# Patient Record
Sex: Female | Born: 1990 | Race: Black or African American | Hispanic: No | Marital: Single | Smoking: Former smoker
Health system: Southern US, Community
[De-identification: ages and names within clinical notes are randomized; demographics above are authoritative.]

## PROBLEM LIST (undated history)

## (undated) DIAGNOSIS — O3410 Maternal care for benign tumor of corpus uteri, unspecified trimester: Secondary | ICD-10-CM

## (undated) DIAGNOSIS — D259 Leiomyoma of uterus, unspecified: Secondary | ICD-10-CM

## (undated) DIAGNOSIS — A630 Anogenital (venereal) warts: Secondary | ICD-10-CM

---
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
## 2012-07-19 ENCOUNTER — Ambulatory Visit: Payer: Self-pay

## 2012-07-19 LAB — CBC WITH DIFFERENTIAL/PLATELET
Basophil #: 0.1 10*3/uL (ref 0.0–0.1)
Basophil %: 0.5 %
Eosinophil #: 0.3 10*3/uL (ref 0.0–0.7)
Lymphocyte #: 2.5 10*3/uL (ref 1.0–3.6)
Lymphocyte %: 24 %
MCH: 29.4 pg (ref 26.0–34.0)
MCHC: 33.9 g/dL (ref 32.0–36.0)
MCV: 87 fL (ref 80–100)
Monocyte #: 0.8 x10 3/mm (ref 0.2–0.9)
Monocyte %: 8 %
Platelet: 317 10*3/uL (ref 150–440)
RBC: 5.05 10*6/uL (ref 3.80–5.20)
RDW: 12.5 % (ref 11.5–14.5)
WBC: 10.6 10*3/uL (ref 3.6–11.0)

## 2012-07-19 LAB — COMPREHENSIVE METABOLIC PANEL
Albumin: 4.1 g/dL (ref 3.4–5.0)
Alkaline Phosphatase: 74 U/L (ref 50–136)
BUN: 10 mg/dL (ref 7–18)
Calcium, Total: 9.2 mg/dL (ref 8.5–10.1)
Chloride: 102 mmol/L (ref 98–107)
Creatinine: 0.92 mg/dL (ref 0.60–1.30)
Glucose: 77 mg/dL (ref 65–99)
Osmolality: 272 (ref 275–301)
Potassium: 3.8 mmol/L (ref 3.5–5.1)
SGPT (ALT): 21 U/L (ref 12–78)
Total Protein: 8.2 g/dL (ref 6.4–8.2)

## 2012-07-19 LAB — URINALYSIS, COMPLETE
Bilirubin,UR: NEGATIVE
Glucose,UR: NEGATIVE mg/dL (ref 0–75)
Nitrite: POSITIVE
Ph: 6.5 (ref 4.5–8.0)
Protein: >=2000
RBC,UR: >30 /HPF (ref 0–5)

## 2012-07-29 ENCOUNTER — Ambulatory Visit: Payer: Self-pay

## 2012-07-29 LAB — URINALYSIS, COMPLETE
Bilirubin,UR: NEGATIVE
Glucose,UR: NEGATIVE mg/dL (ref 0–75)
Ketone: NEGATIVE
Leukocyte Esterase: NEGATIVE
Ph: 7.5 (ref 4.5–8.0)
Protein: NEGATIVE
Specific Gravity: 1.01 (ref 1.003–1.030)

## 2013-03-28 ENCOUNTER — Ambulatory Visit: Payer: Self-pay | Admitting: Physician Assistant

## 2013-03-28 LAB — URINALYSIS, COMPLETE
Bilirubin,UR: NEGATIVE
Blood: NEGATIVE
Glucose,UR: NEGATIVE mg/dL (ref 0–75)
KETONE: NEGATIVE
Leukocyte Esterase: NEGATIVE
NITRITE: NEGATIVE
Ph: 7.5 (ref 4.5–8.0)
Specific Gravity: 1.01 (ref 1.003–1.030)
WBC UR: NONE SEEN /HPF (ref 0–5)

## 2013-03-28 LAB — PREGNANCY, URINE: PREGNANCY TEST, URINE: NEGATIVE m[IU]/mL

## 2013-04-08 ENCOUNTER — Ambulatory Visit: Payer: Self-pay | Admitting: Physician Assistant

## 2014-09-29 ENCOUNTER — Ambulatory Visit
Admission: EM | Admit: 2014-09-29 | Discharge: 2014-09-29 | Disposition: A | Discharge status: Home / Self Care | Payer: Self-pay | Attending: Family Medicine | Admitting: Family Medicine

## 2014-09-29 ENCOUNTER — Encounter: Payer: Self-pay | Admitting: Emergency Medicine

## 2014-09-29 DIAGNOSIS — T50901A Poisoning by unspecified drugs, medicaments and biological substances, accidental (unintentional), initial encounter: Secondary | ICD-10-CM

## 2014-09-29 MED ORDER — TUBERCULIN PPD 5 UNIT/0.1ML ID SOLN
5.0000 [IU] | Freq: Once | INTRADERMAL | Status: DC
  Administered 2014-09-29: 5 [IU] via INTRADERMAL

## 2014-09-29 NOTE — ED Notes (Signed)
Pt seen also by Jerelyn Scott, NP and Nira Conn RN for eval and discuss injections.

## 2014-09-29 NOTE — ED Provider Notes (Signed)
CSN: [id number]     Arrival date & time 09/29/14  1506 History   First MD Initiated Contact with Patient 09/29/14 1601     Chief Complaint  Patient presents with  . PPD Placement   (Consider location/radiation/quality/duration/timing/severity/associated sxs/prior Treatment) HPI Comments: Single african [place] female here for PPD for [employer] requirement.  RN Lolita Cram administered PPD to left forearm administered 70ml and requested and I evaluate patient.  The history is provided by the patient.    History reviewed. No pertinent past medical history. History reviewed. No pertinent past surgical history. History reviewed. No pertinent family history. Social History  Substance Use Topics  . Smoking status: Current Some Day Smoker  . Smokeless tobacco: None  . Alcohol Use: No   OB History    No data available     Review of Systems  Constitutional: Negative for fever, chills, diaphoresis, activity change, appetite change and fatigue.  HENT: Negative for congestion, dental problem, drooling, ear discharge, ear pain, facial swelling, hearing loss, mouth sores, nosebleeds, postnasal drip, rhinorrhea, sinus pressure, sneezing, sore throat, tinnitus, trouble swallowing and voice change.   Eyes: Negative for photophobia, pain, discharge, redness, itching and visual disturbance.  Respiratory: Negative for cough, shortness of breath, wheezing and stridor.   Cardiovascular: Negative for chest pain, palpitations and leg swelling.  Gastrointestinal: Negative for nausea, vomiting, abdominal pain, diarrhea, blood in stool, abdominal distention and rectal pain.  Endocrine: Negative for cold intolerance and heat intolerance.  Genitourinary: Negative for dysuria.  Musculoskeletal: Negative for myalgias, back pain, joint swelling, arthralgias, gait problem, neck pain and neck stiffness.  Skin: Negative for color change, pallor, rash and wound.  Allergic/Immunologic: Negative for  environmental allergies and food allergies.  Neurological: Negative for dizziness, tremors, seizures, syncope, facial asymmetry, speech difficulty, weakness, light-headedness, numbness and headaches.  Hematological: Negative for adenopathy. Does not bruise/bleed easily.  Psychiatric/Behavioral: Negative for behavioral problems, confusion, sleep disturbance and agitation.    Allergies  Review of patient's allergies indicates no known allergies.  Home Medications   Prior to Admission medications   Not on File   Meds Ordered and Administered this Visit   Medications - No data to display  BP 113/71 mmHg  Pulse 72  Temp(Src) 98.5 F (36.9 C) (Oral)  Resp 16  Ht 5' (1.524 m)  Wt 120 lb (54.432 kg)  BMI 23.44 kg/m2  SpO2 100% No data found.   Physical Exam  Constitutional: She is oriented to person, place, and time. Vital signs are normal. She appears well-developed and well-nourished. No distress.  HENT:  Head: Normocephalic and atraumatic.  Right Ear: External ear normal.  Left Ear: External ear normal.  Nose: Nose normal.  Mouth/Throat: Oropharynx is clear and moist. No oropharyngeal exudate.  Eyes: Conjunctivae, EOM and lids are normal. Pupils are equal, round, and reactive to light. Right eye exhibits no discharge. Left eye exhibits no discharge. No scleral icterus.  Neck: Trachea normal and normal range of motion. Neck supple. No tracheal deviation present.  Cardiovascular: Normal rate, regular rhythm and intact distal pulses.   Pulmonary/Chest: Effort normal and breath sounds normal. No stridor. No respiratory distress. She has no wheezes. She has no rales.  Abdominal: Soft. She exhibits no distension.  Musculoskeletal: Normal range of motion. She exhibits no edema or tenderness.  Neurological: She is alert and oriented to person, place, and time. She exhibits normal muscle tone. Coordination normal.  Skin: Skin is warm, dry and intact. No abrasion, no bruising, no burn,  no ecchymosis, no laceration, no lesion, no petechiae and no rash noted. She is not diaphoretic. No cyanosis or erythema. No pallor. Nails show no clubbing.     Subdermal wheel 1cm diameter left anterior forearm no erythema, ecchymosis, itchiness, bleeding or tenderness noted  Psychiatric: She has a normal mood and affect. Her speech is normal and behavior is normal. Judgment and thought content normal. Cognition and memory are normal.  Nursing note and vitals reviewed.   ED Course  Procedures (including critical care time)  Labs Review Labs Reviewed - No data to display  Imaging Review No results found.  [place] contacted pharmacist and manufacturer while I spoke with patient that clarification was being sought regarding if new PPD could be placed in her right forearm or if a wait time would be required after a larger than normal dose was placed in her left forearm by the RN.  Patient denied any pain, SOB, dyspnea, tongue/throat swelling, cough, itching, nausea or vomiting.  Discussed with patient to monitor left forearm over the weekend for rash, edema, erythema, pain and to contact me if any of these symptoms occur as I am working all weekend.  Patient verbalized understanding of information/instructions, agreed with plan of care and had no further questions at this time. MDM   1. Medication administered in error, accidental or unintentional, initial encounter    Patient to follow up for routine PPD reading right arm as instructed by Lolita Cram Sunday.  Follow up sooner if new symptoms left arm, dyspnea, tongue swelling, pruritis, dermatitis.  Patient verbalized understanding of information/instructions, agreed with plan of care and had no further questions at this time.    Olen Cordial, NP 10/01/14 [phone]

## 2014-09-29 NOTE — ED Notes (Signed)
Injected full 1 ml into Left forearm, 50 units, instead of 5 units. Called pharmacy and [employer]. Was informed by company nurse Wyvonne Lenz RN that can give correct dose in other arm and to monitor site where received too much for any adverse reaction.

## 2014-09-29 NOTE — Discharge Instructions (Signed)
Continue to monitor left forearm for swelling, erythema, pain, purulent discharge

## 2014-09-29 NOTE — ED Notes (Signed)
TB shot for school .

## 2014-10-02 ENCOUNTER — Telehealth: Payer: Self-pay | Admitting: Emergency Medicine

## 2014-10-02 NOTE — ED Notes (Signed)
Pt returned for the PPD read. Left arm that received 1 ml of TST showed very slight redness, no induration. Pt reported it was a little more red over the weekend but no other adverse reaction.  Right arm where correct dose PPD placed, no redness or induration. Form completed.

## 2015-05-30 ENCOUNTER — Ambulatory Visit
Admission: EM | Admit: 2015-05-30 | Discharge: 2015-05-30 | Disposition: A | Discharge status: Home / Self Care | Payer: BLUE CROSS/BLUE SHIELD | Attending: Family Medicine | Admitting: Family Medicine

## 2015-05-30 ENCOUNTER — Encounter: Payer: Self-pay | Admitting: Gynecology

## 2015-05-30 DIAGNOSIS — J0120 Acute ethmoidal sinusitis, unspecified: Secondary | ICD-10-CM

## 2015-05-30 DIAGNOSIS — H6593 Unspecified nonsuppurative otitis media, bilateral: Secondary | ICD-10-CM | POA: Diagnosis not present

## 2015-05-30 MED ORDER — ACETAMINOPHEN 500 MG PO TABS: 1000.0000 mg | ORAL_TABLET | Freq: Four times a day (QID) | ORAL | Status: DC | PRN

## 2015-05-30 MED ORDER — FLUTICASONE PROPIONATE 50 MCG/ACT NA SUSP: 1.0000 | Freq: Two times a day (BID) | NASAL | Status: DC

## 2015-05-30 MED ORDER — AMOXICILLIN-POT CLAVULANATE 875-125 MG PO TABS: 1.0000 | ORAL_TABLET | Freq: Two times a day (BID) | ORAL | Status: DC

## 2015-05-30 MED ORDER — SALINE SPRAY 0.65 % NA SOLN: 2.0000 | NASAL | Status: DC

## 2015-05-30 MED ORDER — LORATADINE 10 MG PO TABS: 10.0000 mg | ORAL_TABLET | Freq: Every day | ORAL | Status: DC

## 2015-05-30 NOTE — ED Provider Notes (Signed)
CSN: [id number]     Arrival date & time 05/30/15  1823 History   First MD Initiated Contact with Patient 05/30/15 1914     Chief Complaint  Patient presents with  . Headache  . Ear Fullness   (Consider location/radiation/quality/duration/timing/severity/associated sxs/prior Treatment) HPI Comments: Single african [place] female here for evaluation of headache and right ear pain.  Frontal headache/sinus started after nasal congestion tried mucinex helped a little with nasal congestion.  Pressure behind eye/bridge of nose and forehead  PMHx seasonal allergies does not take medicine for them.  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 female presenting with headaches and plugged ear sensation. The history is provided by the patient.  Headache Pain location:  Frontal Quality:  Dull Radiates to:  Does not radiate Severity currently:  5/10 Severity at highest:  5/10 Onset quality:  Gradual Timing:  Constant Progression:  Worsening Chronicity:  New Similar to prior headaches: yes   Context: activity   Context: not exposure to bright light, not caffeine, not coughing, not defecating, not eating, not stress, not exposure to cold air, not intercourse, not loud noise and not straining   Relieved by:  Nothing Worsened by:  Activity Ineffective treatments: mucinex. Associated symptoms: congestion, drainage, ear pain, facial pain, sinus pressure and URI   Associated symptoms: no abdominal pain, no back pain, no blurred vision, no cough, no diarrhea, no dizziness, no eye pain, no fatigue, no fever, no focal weakness, no hearing loss, no loss of balance, no myalgias, no nausea, no near-syncope, no neck pain, no neck stiffness, no numbness, no paresthesias, no photophobia, no seizures, no sore throat, no swollen glands, no syncope, no tingling, no visual change, no vomiting and no weakness   Congestion:    Location:  Nasal   Interferes with sleep: no      Interferes with eating/drinking: no   Ear pain:    Location:  Right   Severity:  Moderate   Onset quality:  Gradual   Duration:  3 days   Timing:  Constant   Progression:  Worsening   Chronicity:  New Risk factors: no anger, no family hx of SAH, does not have insomnia and lifestyle not sedentary   Ear Fullness Associated symptoms include headaches. Pertinent negatives include no chest pain, no abdominal pain and no shortness of breath.    History reviewed. No pertinent past medical history. History reviewed. No pertinent past surgical history. No family history on file. Social History  Substance Use Topics  . Smoking status: Current Some Day Smoker  . Smokeless tobacco: None  . Alcohol Use: No   OB History    No data available     Review of Systems  Constitutional: Negative for fever, chills, diaphoresis, activity change, appetite change, fatigue and unexpected weight change.  HENT: Positive for congestion, ear pain, postnasal drip, rhinorrhea and sinus pressure. Negative for dental problem, drooling, ear discharge, facial swelling, hearing loss, mouth sores, nosebleeds, sneezing, sore throat, tinnitus, trouble swallowing and voice change.   Eyes: Negative for blurred vision, photophobia, pain, discharge, redness, itching and visual disturbance.  Respiratory: Negative for cough, choking, chest tightness, shortness of breath, wheezing and stridor.   Cardiovascular: Negative for chest pain, palpitations, leg swelling, syncope and near-syncope.  Gastrointestinal: Negative for nausea, vomiting, abdominal pain, diarrhea, constipation, blood in stool and abdominal distention.  Endocrine: Negative for cold intolerance and heat intolerance.  Genitourinary: Negative for dysuria, hematuria and difficulty  urinating.  Musculoskeletal: Negative for myalgias, back pain, joint swelling, arthralgias, gait problem, neck pain and neck stiffness.  Skin: Negative for color change, pallor, rash  and wound.  Allergic/Immunologic: Positive for environmental allergies. Negative for food allergies.  Neurological: Positive for headaches. Negative for dizziness, tremors, focal weakness, seizures, syncope, facial asymmetry, speech difficulty, weakness, light-headedness, numbness, paresthesias and loss of balance.  Hematological: Negative for adenopathy. Does not bruise/bleed easily.  Psychiatric/Behavioral: Negative for behavioral problems, confusion, sleep disturbance and agitation.    Allergies  Review of patient's allergies indicates no known allergies.  Home Medications   Prior to Admission medications   Medication Sig Start Date End Date Taking? Authorizing Provider  acetaminophen (TYLENOL) 500 MG tablet Take 2 tablets (1,000 mg total) by mouth every 6 (six) hours as needed for mild pain, moderate pain or headache. 05/30/15   Olen Cordial, NP  amoxicillin-clavulanate (AUGMENTIN) 875-125 MG tablet Take 1 tablet by mouth every 12 (twelve) hours. 05/30/15   Olen Cordial, NP  fluticasone (FLONASE) 50 MCG/ACT nasal spray Place 1 spray into both nostrils 2 (two) times daily. 05/30/15   Olen Cordial, NP  loratadine (CLARITIN) 10 MG tablet Take 1 tablet (10 mg total) by mouth daily. 05/30/15 06/13/15  Olen Cordial, NP  sodium chloride (OCEAN) 0.65 % SOLN nasal spray Place 2 sprays into both nostrils every 2 (two) hours while awake. 05/30/15 06/20/15  Olen Cordial, NP   Meds Ordered and Administered this Visit  Medications - No data to display  BP 118/79 mmHg  Pulse 88  Temp(Src) 97.9 F (36.6 C) (Oral)  Resp 16  Ht 5\' 1"  (1.549 m)  Wt 125 lb (56.7 kg)  BMI 23.63 kg/m2  SpO2 100%  LMP 05/07/2015 No data found.   Physical Exam  Constitutional: She is oriented to person, place, and time. Vital signs are normal. She appears well-developed and well-nourished. She is active and cooperative.  Non-toxic appearance. She does not have a sickly appearance. She appears ill.  No distress.  HENT:  Head: Normocephalic and atraumatic.  Right Ear: Hearing, external ear and ear canal normal. A middle ear effusion is present.  Left Ear: Hearing, external ear and ear canal normal. A middle ear effusion is present.  Nose: Mucosal edema and rhinorrhea present. No nose lacerations, sinus tenderness, nasal deformity, septal deviation or nasal septal hematoma. No epistaxis.  No foreign bodies. Right sinus exhibits frontal sinus tenderness. Right sinus exhibits no maxillary sinus tenderness. Left sinus exhibits no maxillary sinus tenderness and no frontal sinus tenderness.  Mouth/Throat: Uvula is midline and mucous membranes are normal. Mucous membranes are not pale, not dry and not cyanotic. She does not have dentures. No oral lesions. No trismus in the jaw. Normal dentition. No dental abscesses, uvula swelling, lacerations or dental caries. Posterior oropharyngeal edema and posterior oropharyngeal erythema present. No oropharyngeal exudate or tonsillar abscesses.  Nasal congestion/voice; bilateral nasal turbinates edema/erythema/clear discharge; bilateral allergic shiners; cobblestoning posterior pharynx; bilateral TMs slight opacity air fluid level right greater than left  Eyes: Conjunctivae, EOM and lids are normal. Pupils are equal, round, and reactive to light. Right eye exhibits no chemosis, no discharge, no exudate and no hordeolum. No foreign body present in the right eye. Left eye exhibits no chemosis, no discharge, no exudate and no hordeolum. No foreign body present in the left eye. Right conjunctiva is not injected. Right conjunctiva has no hemorrhage. Left conjunctiva is not injected. Left conjunctiva has no hemorrhage. No scleral icterus.  Right eye exhibits normal extraocular motion and no nystagmus. Left eye exhibits normal extraocular motion and no nystagmus. Right pupil is round and reactive. Left pupil is round and reactive. Pupils are equal.  Neck: Trachea normal and  normal range of motion. Neck supple. No tracheal tenderness, no spinous process tenderness and no muscular tenderness present. No rigidity. No tracheal deviation, no edema, no erythema and normal range of motion present. No thyroid mass and no thyromegaly present.  Cardiovascular: Normal rate, regular rhythm, S1 normal, S2 normal, normal heart sounds and intact distal pulses.  PMI is not displaced.  Exam reveals no gallop and no friction rub.   No murmur heard. Pulmonary/Chest: Effort normal and breath sounds normal. No accessory muscle usage or stridor. No respiratory distress. She has no decreased breath sounds. She has no wheezes. She has no rhonchi. She has no rales. She exhibits no tenderness.  Abdominal: Soft. She exhibits no distension.  Musculoskeletal: Normal range of motion. She exhibits no edema or tenderness.       Right shoulder: Normal.       Left shoulder: Normal.       Right hip: Normal.       Left hip: Normal.       Right knee: Normal.       Left knee: Normal.       Cervical back: Normal.       Right hand: Normal.       Left hand: Normal.  Lymphadenopathy:       Head (right side): No submental, no submandibular, no tonsillar, no preauricular, no posterior auricular and no occipital adenopathy present.       Head (left side): No submental, no submandibular, no tonsillar, no preauricular, no posterior auricular and no occipital adenopathy present.    She has no cervical adenopathy.       Right cervical: No superficial cervical, no deep cervical and no posterior cervical adenopathy present.      Left cervical: No superficial cervical, no deep cervical and no posterior cervical adenopathy present.  TTP anterior and posterior cervical lymph nodes no nodules or enlargement noted to palpation  Neurological: She is alert and oriented to person, place, and time. She has normal strength. She is not disoriented. She displays no atrophy and no tremor. No cranial nerve deficit or sensory  deficit. She exhibits normal muscle tone. She displays no seizure activity. Coordination and gait normal. GCS eye subscore is 4. GCS verbal subscore is 5. GCS motor subscore is 6.  Skin: Skin is warm, dry and intact. No abrasion, no bruising, no burn, no ecchymosis, no laceration, no lesion, no petechiae and no rash noted. She is not diaphoretic. No cyanosis or erythema. No pallor. Nails show no clubbing.  Psychiatric: She has a normal mood and affect. Her speech is normal and behavior is normal. Judgment and thought content normal. Cognition and memory are normal.  Nursing note and vitals reviewed.   ED Course  Procedures (including critical care time)  Labs Review Labs Reviewed - No data to display  Imaging Review No results found.    MDM   1. Acute ethmoidal sinusitis, recurrence not specified   2. Otitis media with effusion, bilateral    Denied need for school excuse able to concentrate/sleep.  May continue mucinex po BID OTC if helping symptoms.  Patient may use normal saline nasal spray as needed.  Consider antihistamine OTC of choice example claritin 10mg  po daily in addition to flonase 1 spray each  nostril BID.  Avoid triggers if possible.  Shower prior to bedtime if exposed to triggers.  If allergic dust/dust mites recommend mattress/pillow covers/encasements; washing linens, vacuuming, sweeping, dusting weekly.  Call or return to clinic as needed if these symptoms worsen or fail to improve as anticipated.   Exitcare handout on allergic rhinitis given to patient.  Patient verbalized understanding of instructions, agreed with plan of care and had no further questions at this time.  P2:  Avoidance and hand washing.  Supportive treatment.   No evidence of invasive bacterial infection, non toxic and well hydrated.  This is most likely self limiting viral infection.  I do not see where any further testing or imaging is necessary at this time.   I will suggest supportive care, rest, good  hygiene and encourage the patient to take adequate fluids.  The patient is to return to clinic or EMERGENCY ROOM if symptoms worsen or change significantly e.g. ear pain, fever, purulent discharge from ears or bleeding.  Exitcare handout on otitis media with effusion given to patient.  Patient verbalized agreement and understanding of treatment plan.    start flonase 1 spray each nostril BID, saline 2 sprays each nostril q2h prn congestion.  If no improvement with 48 hours of saline and flonase use start augmentin 875mg  po BID x 10 days. Tylenol 1000mg  po QID prn headache/pain.  Rx given.  No evidence of systemic bacterial infection, non toxic and well hydrated.  I do not see where any further testing or imaging is necessary at this time.   I will suggest supportive care, rest, good hygiene and encourage the patient to take adequate fluids.  The patient is to return to clinic or EMERGENCY ROOM if symptoms worsen or change significantly.  Exitcare handout on sinusitis given to patient.  Patient verbalized agreement and understanding of treatment plan and had no further questions at this time.   P2:  Hand washing and cover cough    Olen Cordial, NP 05/30/15 1935

## 2015-05-30 NOTE — ED Notes (Signed)
Patient c/o headache and right ear fullness x 3 days.

## 2015-05-30 NOTE — Discharge Instructions (Signed)
Allergic Rhinitis Allergic rhinitis is when the mucous membranes in the nose respond to allergens. Allergens are particles in the air that cause your body to have an allergic reaction. This causes you to release allergic antibodies. Through a chain of events, these eventually cause you to release histamine into the blood stream. Although meant to protect the body, it is this release of histamine that causes your discomfort, such as frequent sneezing, congestion, and an itchy, runny nose.  CAUSES Seasonal allergic rhinitis (hay fever) is caused by pollen allergens that may come from grasses, trees, and weeds. Year-round allergic rhinitis (perennial allergic rhinitis) is caused by allergens such as house dust mites, pet dander, and mold spores. SYMPTOMS  Nasal stuffiness (congestion).  Itchy, runny nose with sneezing and tearing of the eyes. DIAGNOSIS Your health care provider can help you determine the allergen or allergens that trigger your symptoms. If you and your health care provider are unable to determine the allergen, skin or blood testing may be used. Your health care provider will diagnose your condition after taking your health history and performing a physical exam. Your health care provider may assess you for other related conditions, such as asthma, pink eye, or an ear infection. TREATMENT Allergic rhinitis does not have a cure, but it can be controlled by:  Medicines that block allergy symptoms. These may include allergy shots, nasal sprays, and oral antihistamines.  Avoiding the allergen. Hay fever may often be treated with antihistamines in pill or nasal spray forms. Antihistamines block the effects of histamine. There are over-the-counter medicines that may help with nasal congestion and swelling around the eyes. Check with your health care provider before taking or giving this medicine. If avoiding the allergen or the medicine prescribed do not work, there are many new medicines  your health care provider can prescribe. Stronger medicine may be used if initial measures are ineffective. Desensitizing injections can be used if medicine and avoidance does not work. Desensitization is when a patient is given ongoing shots until the body becomes less sensitive to the allergen. Make sure you follow up with your health care provider if problems continue. HOME CARE INSTRUCTIONS It is not possible to completely avoid allergens, but you can reduce your symptoms by taking steps to limit your exposure to them. It helps to know exactly what you are allergic to so that you can avoid your specific triggers. SEEK MEDICAL CARE IF:  You have a fever.  You develop a cough that does not stop easily (persistent).  You have shortness of breath.  You start wheezing.  Symptoms interfere with normal daily activities.   This information is not intended to replace advice given to you by your health care provider. Make sure you discuss any questions you have with your health care provider.   Document Released: 10/01/2000 Document Revised: 01/27/2014 Document Reviewed: 09/13/2012 Elsevier Interactive Patient Education 2016 [place]. Otitis Media With Effusion Otitis media with effusion is the presence of fluid in the middle ear. This is a common problem in children, which often follows ear infections. It may be present for weeks or longer after the infection. Unlike an acute ear infection, otitis media with effusion refers only to fluid behind the ear drum and not infection. Children with repeated ear and sinus infections and allergy problems are the most likely to get otitis media with effusion. CAUSES  The most frequent cause of the fluid buildup is dysfunction of the eustachian tubes. These are the tubes that drain fluid  in the ears to the back of the nose (nasopharynx). °SYMPTOMS  °· The main symptom of this condition is hearing loss. As a result, you or your child may: °¨ Listen to the TV  at a loud volume. °¨ Not respond to questions. °¨ Ask "what" often when spoken to. °¨ Mistake or confuse one sound or word for another. °· There may be a sensation of fullness or pressure but usually not pain. °DIAGNOSIS  °· Your health care provider will diagnose this condition by examining you or your child's ears. °· Your health care provider may test the pressure in you or your child's ear with a tympanometer. °· A hearing test may be conducted if the problem persists. °TREATMENT  °· Treatment depends on the duration and the effects of the effusion. °· Antibiotics, decongestants, nose drops, and cortisone-type drugs (tablets or nasal spray) may not be helpful. °· Children with persistent ear effusions may have delayed language or behavioral problems. Children at risk for developmental delays in hearing, learning, and speech may require referral to a specialist earlier than children not at risk. °· You or your child's health care provider may suggest a referral to an ear, nose, and throat surgeon for treatment. The following may help restore normal hearing: °¨ Drainage of fluid. °¨ Placement of ear tubes (tympanostomy tubes). °¨ Removal of adenoids (adenoidectomy). °HOME CARE INSTRUCTIONS  °· Avoid secondhand smoke. °· Infants who are breastfed are less likely to have this condition. °· Avoid feeding infants while they are lying flat. °· Avoid known environmental allergens. °· Avoid people who are sick. °SEEK MEDICAL CARE IF:  °· Hearing is not better in 3 months. °· Hearing is worse. °· Ear pain. °· Drainage from the ear. °· Dizziness. °MAKE SURE YOU:  °· Understand these instructions. °· Will watch your condition. °· Will get help right away if you are not doing well or get worse. °  °This information is not intended to replace advice given to you by your health care provider. Make sure you discuss any questions you have with your health care provider. °  °Document Released: 02/14/2004 Document Revised:  01/27/2014 Document Reviewed: 08/03/2012 °Elsevier Interactive Patient Education ©2016 Elsevier Inc[employer] °Sinusitis, Adult °Sinusitis is redness, soreness, and inflammation of the paranasal sinuses. Paranasal sinuses are air pockets within the bones of your face. They are located beneath your eyes, in the middle of your forehead, and above your eyes. In healthy paranasal sinuses, mucus is able to drain out, and air is able to circulate through them by way of your nose. However, when your paranasal sinuses are inflamed, mucus and air can become trapped. This can allow bacteria and other germs to grow and cause infection. °Sinusitis can develop quickly and last only a short time (acute) or continue over a long period (chronic). Sinusitis that lasts for more than 12 weeks is considered chronic. °CAUSES °Causes of sinusitis include: °· Allergies. °· Structural abnormalities, such as displacement of the cartilage that separates your nostrils (deviated septum), which can decrease the air flow through your nose and sinuses and affect sinus drainage. °· Functional abnormalities, such as when the small hairs (cilia) that line your sinuses and help remove mucus do not work properly or are not present. °SIGNS AND SYMPTOMS °Symptoms of acute and chronic sinusitis are the same. The primary symptoms are pain and pressure around the affected sinuses. Other symptoms include: °· Upper toothache. °· Earache. °· Headache. °· Bad breath. °· Decreased sense of smell and taste. °·   A cough, which worsens when you are lying flat.  Fatigue.  Fever.  Thick drainage from your nose, which often is green and may contain pus (purulent).  Swelling and warmth over the affected sinuses. DIAGNOSIS Your health care provider will perform a physical exam. During your exam, your health care provider may perform any of the following to help determine if you have acute sinusitis or chronic sinusitis:  Look in your nose for signs of abnormal  growths in your nostrils (nasal polyps).  Tap over the affected sinus to check for signs of infection.  View the inside of your sinuses using an imaging device that has a light attached (endoscope). If your health care provider suspects that you have chronic sinusitis, one or more of the following tests may be recommended:  Allergy tests.  Nasal culture. A sample of mucus is taken from your nose, sent to a lab, and screened for bacteria.  Nasal cytology. A sample of mucus is taken from your nose and examined by your health care provider to determine if your sinusitis is related to an allergy. TREATMENT Most cases of acute sinusitis are related to a viral infection and will resolve on their own within 10 days. Sometimes, medicines are prescribed to help relieve symptoms of both acute and chronic sinusitis. These may include pain medicines, decongestants, nasal steroid sprays, or saline sprays. However, for sinusitis related to a bacterial infection, your health care provider will prescribe antibiotic medicines. These are medicines that will help kill the bacteria causing the infection. Rarely, sinusitis is caused by a fungal infection. In these cases, your health care provider will prescribe antifungal medicine. For some cases of chronic sinusitis, surgery is needed. Generally, these are cases in which sinusitis recurs more than 3 times per year, despite other treatments. HOME CARE INSTRUCTIONS  Drink plenty of water. Water helps thin the mucus so your sinuses can drain more easily.  Use a humidifier.  Inhale steam 3-4 times a day (for example, sit in the bathroom with the shower running).  Apply a warm, moist washcloth to your face 3-4 times a day, or as directed by your health care provider.  Use saline nasal sprays to help moisten and clean your sinuses.  Take medicines only as directed by your health care provider.  If you were prescribed either an antibiotic or antifungal medicine,  finish it all even if you start to feel better. SEEK IMMEDIATE MEDICAL CARE IF:  You have increasing pain or severe headaches.  You have nausea, vomiting, or drowsiness.  You have swelling around your face.  You have vision problems.  You have a stiff neck.  You have difficulty breathing.   This information is not intended to replace advice given to you by your health care provider. Make sure you discuss any questions you have with your health care provider.   Document Released: 01/06/2005 Document Revised: 01/27/2014 Document Reviewed: 01/21/2011 Elsevier Interactive Patient Education [employer].

## 2015-10-04 ENCOUNTER — Encounter: Payer: Self-pay | Admitting: *Deleted

## 2015-10-04 ENCOUNTER — Ambulatory Visit
Admission: EM | Admit: 2015-10-04 | Discharge: 2015-10-04 | Disposition: A | Discharge status: Home / Self Care | Payer: BLUE CROSS/BLUE SHIELD | Attending: Internal Medicine | Admitting: Internal Medicine

## 2015-10-04 DIAGNOSIS — R5383 Other fatigue: Secondary | ICD-10-CM | POA: Insufficient documentation

## 2015-10-04 DIAGNOSIS — R079 Chest pain, unspecified: Secondary | ICD-10-CM | POA: Diagnosis present

## 2015-10-04 LAB — CBC WITH DIFFERENTIAL/PLATELET
BASOS PCT: 1 %
Basophils Absolute: 0.1 10*3/uL (ref 0–0.1)
Eosinophils Absolute: 0.3 10*3/uL (ref 0–0.7)
Eosinophils Relative: 5 %
HEMATOCRIT: 41.3 % (ref 35.0–47.0)
HEMOGLOBIN: 13.8 g/dL (ref 12.0–16.0)
LYMPHS ABS: 2.7 10*3/uL (ref 1.0–3.6)
LYMPHS PCT: 44 %
MCH: 29.1 pg (ref 26.0–34.0)
MCHC: 33.4 g/dL (ref 32.0–36.0)
MCV: 87.3 fL (ref 80.0–100.0)
MONO ABS: 0.7 10*3/uL (ref 0.2–0.9)
MONOS PCT: 12 %
NEUTROS ABS: 2.4 10*3/uL (ref 1.4–6.5)
Neutrophils Relative %: 38 %
Platelets: 322 10*3/uL (ref 150–440)
RBC: 4.73 MIL/uL (ref 3.80–5.20)
RDW: 13.1 % (ref 11.5–14.5)
WBC: 6.2 10*3/uL (ref 3.6–11.0)

## 2015-10-04 LAB — COMPREHENSIVE METABOLIC PANEL
ALK PHOS: 66 U/L (ref 38–126)
ALT: 13 U/L — ABNORMAL LOW (ref 14–54)
ANION GAP: 4 — AB (ref 5–15)
AST: 21 U/L (ref 15–41)
Albumin: 4.2 g/dL (ref 3.5–5.0)
BILIRUBIN TOTAL: 0.5 mg/dL (ref 0.3–1.2)
BUN: 14 mg/dL (ref 6–20)
CALCIUM: 8.7 mg/dL — AB (ref 8.9–10.3)
CO2: 23 mmol/L (ref 22–32)
Chloride: 107 mmol/L (ref 101–111)
Creatinine, Ser: 0.62 mg/dL (ref 0.44–1.00)
Glucose, Bld: 93 mg/dL (ref 65–99)
POTASSIUM: 3.4 mmol/L — AB (ref 3.5–5.1)
Sodium: 134 mmol/L — ABNORMAL LOW (ref 135–145)
TOTAL PROTEIN: 7.5 g/dL (ref 6.5–8.1)

## 2015-10-04 NOTE — ED Triage Notes (Signed)
Pt has had fatigue/drowsiness and a central chest "heaviness" x2 weeks. States sensation in chest is non-radiating, and is worse with lying on her side and with deep respiration.

## 2015-10-04 NOTE — Discharge Instructions (Signed)
Symptoms of chest tightness/heaviness and fatigue seem most likely to be due to physical exhaustion from inadequate sleep. We will call you if your lab tests are abnormal.  These results will also be available on MyChart (sign up instructions are attached).   Try to get 6-7 hours of sleep daily at a consistent time.   Consider contacting a grief counselor (Hospice can recommend one) after the recent loss of your fiance; website is  [email or web address] or call [phone]

## 2015-10-04 NOTE — ED Provider Notes (Addendum)
[facility]    CSN: [id number] Arrival date & time: 10/04/15  [id number]  First Provider Contact:  First MD Initiated Contact with Patient 10/04/15 1024        History   Chief Complaint Chief Complaint  Patient presents with  . Fatigue  . Chest Pain    HPI Sharon Soto is a 25 y.o. female. Her fianc died 4 months ago, and since she has been trying to stay busy and distracted. 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  In the last 2 weeks, she has been extremely tired, dozing off when she is supposed to be awake. She has had a lot of substernal chest heaviness and tightness.  No nausea/vomiting, no indigestion, appetite is okay. Not currently having sad or self-destructive thoughts, not feeling like hurting anyone else.  The last 2 nights, she laid down to have a nap before her night shift job, and then slept all the way through it. Her mother has a history of anemia, and she is a little worried about being anemic. She would like some labs checked.    HPI  History reviewed. No pertinent past medical history.  History reviewed. No pertinent surgical history.   Home Medications    Prior to Admission medications   Medication Sig Start Date End Date Taking? Authorizing Provider  fluticasone (FLONASE) 50 MCG/ACT nasal spray Place 1 spray into both nostrils 2 (two) times daily. 05/30/15  Yes Olen Cordial, NP  acetaminophen (TYLENOL) 500 MG tablet Take 2 tablets (1,000 mg total) by mouth every 6 (six) hours as needed for mild pain, moderate pain or headache. 05/30/15   Olen Cordial, NP    Family History Diabetes, hypertension. Mother has history of anemia.  Social History Social History  Substance Use Topics  . Smoking status: Former Research scientist (life sciences)  . Smokeless tobacco: Never Used  . Alcohol use Yes     Allergies   Review of patient's allergies  indicates no known allergies.   Review of Systems Review of Systems  All other systems reviewed and are negative.    Physical Exam Triage Vital Signs ED Triage Vitals  Enc Vitals Group     BP 10/04/15 1014 109/67     Pulse Rate 10/04/15 1014 73     Resp 10/04/15 1014 16     Temp 10/04/15 1014 98.2 F (36.8 C)     Temp Source 10/04/15 1014 Oral     SpO2 10/04/15 1014 100 %     Weight 10/04/15 1016 124 lb (56.2 kg)     Height 10/04/15 1016 5\' 1"  (1.549 m)     Pain Score 10/04/15 1019 7   Updated Vital Signs BP 109/67 (BP Location: Left Arm)   Pulse 73   Temp 98.2 F (36.8 C) (Oral)   Resp 16   Ht 5\' 1"  (1.549 m)   Wt 124 lb (56.2 kg)   LMP 09/17/2015 (Exact Date)   SpO2 100%   BMI 23.43 kg/m  Physical Exam  Constitutional: She is oriented to person, place, and time. No distress.  Alert, nicely groomed  HENT:  Head: Atraumatic.  Eyes:  Conjugate gaze, no eye redness/drainage  Neck: Neck supple.  Cardiovascular: Normal rate and regular rhythm.   Pulmonary/Chest: No respiratory distress. She has no wheezes. She has no rales.  Lungs clear, symmetric breath sounds  Abdominal: Soft. She exhibits no distension.  There is no tenderness. There is no guarding.  Musculoskeletal: Normal range of motion.  No leg swelling  Neurological: She is alert and oriented to person, place, and time.  Skin: Skin is warm and dry.  No cyanosis  Nursing note and vitals reviewed.    UC Treatments / Results  Labs (all labs ordered are listed, but only abnormal results are displayed) Labs Reviewed  COMPREHENSIVE METABOLIC PANEL - Abnormal; Notable for the following:       Result Value   Sodium 134 (*)    Potassium 3.4 (*)    Calcium 8.7 (*)    ALT 13 (*)    Anion gap 4 (*)    All other components within normal limits  CBC WITH DIFFERENTIAL/PLATELET     Results for orders placed or performed during the hospital encounter of 10/04/15  CBC with Differential  Result Value Ref  Range   WBC 6.2 3.6 - 11.0 K/uL   RBC 4.73 3.80 - 5.20 MIL/uL   Hemoglobin 13.8 12.0 - 16.0 g/dL   HCT 41.3 35.0 - 47.0 %   MCV 87.3 80.0 - 100.0 fL   MCH 29.1 26.0 - 34.0 pg   MCHC 33.4 32.0 - 36.0 g/dL   RDW 13.1 11.5 - 14.5 %   Platelets 322 150 - 440 K/uL   Neutrophils Relative % 38 %   Neutro Abs 2.4 1.4 - 6.5 K/uL   Lymphocytes Relative 44 %   Lymphs Abs 2.7 1.0 - 3.6 K/uL   Monocytes Relative 12 %   Monocytes Absolute 0.7 0.2 - 0.9 K/uL   Eosinophils Relative 5 %   Eosinophils Absolute 0.3 0 - 0.7 K/uL   Basophils Relative 1 %   Basophils Absolute 0.1 0 - 0.1 K/uL  Comprehensive metabolic panel  Result Value Ref Range   Sodium 134 (L) 135 - 145 mmol/L   Potassium 3.4 (L) 3.5 - 5.1 mmol/L   Chloride 107 101 - 111 mmol/L   CO2 23 22 - 32 mmol/L   Glucose, Bld 93 65 - 99 mg/dL   BUN 14 6 - 20 mg/dL   Creatinine, Ser 0.62 0.44 - 1.00 mg/dL   Calcium 8.7 (L) 8.9 - 10.3 mg/dL   Total Protein 7.5 6.5 - 8.1 g/dL   Albumin 4.2 3.5 - 5.0 g/dL   AST 21 15 - 41 U/L   ALT 13 (L) 14 - 54 U/L   Alkaline Phosphatase 66 38 - 126 U/L   Total Bilirubin 0.5 0.3 - 1.2 mg/dL   GFR calc non Af Amer >60 >60 mL/min   GFR calc Af Amer >60 >60 mL/min   Anion gap 4 (L) 5 - 15     EKG EKG at the urgent care was reviewed by me,  heart rate was 83 beats per minute. Normal sinus rhythm, no ectopy, no acute ST or T-wave changes. PR 174 ms QRS 72 ms  QTC 423 ms QRS axis 35, normal    Procedures Procedures (including critical care time)      None today   Final Clinical Impressions(s) / UC Diagnoses   Final diagnoses:  Exhaustion    EKG benign. Laboratory studies did not suggest a particular etiology for fatigue. I think the patient just needs to get more than 5 hours of sleep per night. Might also benefit from grief counseling, phone number website for hospice given.   Sherlene Shams, MD 10/04/15 [place], MD 10/04/15 1700

## 2015-11-08 ENCOUNTER — Telehealth: Payer: Self-pay | Admitting: Emergency Medicine

## 2015-11-08 ENCOUNTER — Ambulatory Visit
Admission: EM | Admit: 2015-11-08 | Discharge: 2015-11-08 | Disposition: A | Discharge status: Home / Self Care | Payer: BLUE CROSS/BLUE SHIELD | Attending: Emergency Medicine | Admitting: Emergency Medicine

## 2015-11-08 DIAGNOSIS — Z113 Encounter for screening for infections with a predominantly sexual mode of transmission: Secondary | ICD-10-CM | POA: Diagnosis not present

## 2015-11-08 DIAGNOSIS — R1031 Right lower quadrant pain: Secondary | ICD-10-CM

## 2015-11-08 DIAGNOSIS — N898 Other specified noninflammatory disorders of vagina: Secondary | ICD-10-CM | POA: Diagnosis not present

## 2015-11-08 LAB — WET PREP, GENITAL
CLUE CELLS WET PREP: NONE SEEN
SPERM: NONE SEEN
TRICH WET PREP: NONE SEEN
WBC WET PREP: NONE SEEN
YEAST WET PREP: NONE SEEN

## 2015-11-08 LAB — CHLAMYDIA/NGC RT PCR (ARMC ONLY)
Chlamydia Tr: NOT DETECTED
N gonorrhoeae: NOT DETECTED

## 2015-11-08 LAB — PREGNANCY, URINE: Preg Test, Ur: NEGATIVE

## 2015-11-08 MED ORDER — NORGESTIMATE-ETH ESTRADIOL 0.25-35 MG-MCG PO TABS: 1.0000 | ORAL_TABLET | Freq: Every day | ORAL | 0 refills | Status: DC

## 2015-11-08 MED ORDER — FLUCONAZOLE 150 MG PO TABS: 150.0000 mg | ORAL_TABLET | Freq: Once | ORAL | 0 refills | Status: AC

## 2015-11-08 NOTE — Telephone Encounter (Signed)
Patient returned our phone call and was notified that her GC/Chlamydia tests were Negative.

## 2015-11-08 NOTE — ED Provider Notes (Signed)
[facility]    CSN: [id number] Arrival date & time: 11/08/15  1119     History   Chief Complaint Chief Complaint  Patient presents with  . Exposure to STD    HPI Sharon Soto is a 25 y.o. female.   Patient's here for vaginal discharge. She states she has slight discharge but she also states that she needs birth control pills refilled. Apparently she's not been sexually active since her fianc was killed in an automobile accident. Explained to her that we can bridge her for one month until she can see a PCP and get started on her birth control pills since she's planning to become sexually active again soon  Her last menses was  September 25 and she's been recently tested for HIV and RPR and she declined blood work test for those infections at this time. She is a gravida 0 para 0 AB 0. Mother has hypertension but she has no other medical problems she does not smoke no known drug allergies no previous surgeries or operations and no chronic medical problems either.   The history is provided by the patient. No language interpreter was used.  Exposure to STD  This is a new problem. The problem has not changed since onset.Pertinent negatives include no chest pain, no abdominal pain, no headaches and no shortness of breath. Nothing aggravates the symptoms. Nothing relieves the symptoms. She has tried nothing for the symptoms.    History reviewed. No pertinent past medical history.  There are no active problems to display for this patient.   Past Surgical History:  Procedure Laterality Date  . NO PAST SURGERIES      OB History    No data available       Home Medications    Prior to Admission medications   Medication Sig Start Date End Date Taking? Authorizing Provider  acetaminophen (TYLENOL) 500 MG tablet Take 2 tablets (1,000 mg total) by mouth every 6 (six) hours as needed for mild pain, moderate pain or headache. 05/30/15   Olen Cordial, NP  fluconazole  (DIFLUCAN) 150 MG tablet Take 1 tablet (150 mg total) by mouth once. 11/08/15 11/08/15  Frederich Cha, MD  fluticasone (FLONASE) 50 MCG/ACT nasal spray Place 1 spray into both nostrils 2 (two) times daily. 05/30/15   Olen Cordial, NP  norgestimate-ethinyl estradiol (SPRINTEC 28) 0.25-35 MG-MCG tablet Take 1 tablet by mouth daily. 11/08/15   Frederich Cha, MD    Family History History reviewed. No pertinent family history.  Social History Social History  Substance Use Topics  . Smoking status: Former Research scientist (life sciences)  . Smokeless tobacco: Never Used  . Alcohol use Yes     Comment: occasionally     Allergies   Review of patient's allergies indicates no known allergies.   Review of Systems Review of Systems  Respiratory: Negative for shortness of breath.   Cardiovascular: Negative for chest pain.  Gastrointestinal: Negative for abdominal pain.  Genitourinary: Positive for vaginal discharge.  Neurological: Negative for headaches.  All other systems reviewed and are negative.    Physical Exam Triage Vital Signs ED Triage Vitals  Enc Vitals Group     BP 11/08/15 1159 110/61     Pulse Rate 11/08/15 1159 72     Resp 11/08/15 1159 16     Temp 11/08/15 1159 97.3 F (36.3 C)     Temp Source 11/08/15 1159 Tympanic     SpO2 11/08/15 1159 100 %  Weight 11/08/15 1159 120 lb (54.4 kg)     Height 11/08/15 1159 5' (1.524 m)     Head Circumference --      Peak Flow --      Pain Score 11/08/15 1200 0     Pain Loc --      Pain Edu? --      Excl. in [place]? --    No data found.   Updated Vital Signs BP 110/61 (BP Location: Left Arm)   Pulse 72   Temp 97.3 F (36.3 C) (Tympanic)   Resp 16   Ht 5' (1.524 m)   Wt 120 lb (54.4 kg)   LMP 10/15/2015   SpO2 100%   BMI 23.44 kg/m   Visual Acuity Right Eye Distance:   Left Eye Distance:   Bilateral Distance:    Right Eye Near:   Left Eye Near:    Bilateral Near:     Physical Exam  Constitutional: She is oriented to person, place,  and time. She appears well-developed and well-nourished.  HENT:  Head: Normocephalic and atraumatic.  Eyes: EOM are normal. Pupils are equal, round, and reactive to light.  Neck: Normal range of motion.  Pulmonary/Chest: Effort normal.  Abdominal: Soft. Bowel sounds are normal. There is no hepatosplenomegaly. There is tenderness in the right lower quadrant. There is no rigidity and no CVA tenderness. No hernia. Hernia confirmed negative in the right inguinal area.    Genitourinary: Rectum normal, vagina normal and uterus normal. Rectal exam shows no external hemorrhoid and no internal hemorrhoid. There is no rash, tenderness, lesion or injury on the right labia. There is no rash, tenderness, lesion or injury on the left labia. Uterus is not deviated, not enlarged, not fixed and not tender. Cervix exhibits friability. Cervix exhibits no discharge. Right adnexum displays tenderness. No signs of injury around the vagina.  Genitourinary Comments: She has some mild tenderness in the right adnexal area over the right ovary but no marked fullness and distention  Musculoskeletal: Normal range of motion.  Neurological: She is alert and oriented to person, place, and time.  Skin: Skin is warm and dry.  Psychiatric: She has a normal mood and affect.  Vitals reviewed.    UC Treatments / Results  Labs (all labs ordered are listed, but only abnormal results are displayed) Labs Reviewed  WET PREP, GENITAL  CHLAMYDIA/NGC RT PCR ([facility] ONLY)  PREGNANCY, URINE    EKG  EKG Interpretation None       Radiology No results found.  Procedures Procedures (including critical care time)  Medications Ordered in UC Medications - No data to display   Initial Impression / Assessment and Plan / UC Course  I have reviewed the triage vital signs and the nursing notes.  Pertinent labs & imaging results that were available during my care of the patient were reviewed by me and considered in my  medical decision making (see chart for details).  Clinical Course    Will recommend patient when she has follow-up to be put on long-term birth control pills she mentioned to them that she has some right lower quadrant tenderness. Right ovary cannot appear to be impressive and the tenderness is very mild and may actually clear after next cycle. We'll give her birth control pills for one month. Since no pathogen was identified since his bacterial vaginosis or ecchymosis will give her a prescription for Diflucan to use to like to see if that takes care of any remaining vaginal discharge  or discomfort Final Clinical Impressions(s) / UC Diagnoses   Final diagnoses:  Vaginal discharge  Screen for STD (sexually transmitted disease)  Right lower quadrant abdominal pain    New Prescriptions New Prescriptions   FLUCONAZOLE (DIFLUCAN) 150 MG TABLET    Take 1 tablet (150 mg total) by mouth once.   NORGESTIMATE-ETHINYL ESTRADIOL (SPRINTEC 28) 0.25-35 MG-MCG TABLET    Take 1 tablet by mouth daily.     Frederich Cha, MD 11/08/15 1309

## 2015-11-08 NOTE — ED Triage Notes (Signed)
Patient states that she would like to get STD tested. Patient has no known exposure. Patient states that she would also like to start [facility]. Patient states that she is currently not on any BC.

## 2015-11-11 ENCOUNTER — Telehealth: Payer: Self-pay

## 2016-01-25 ENCOUNTER — Encounter: Payer: Self-pay | Admitting: Emergency Medicine

## 2016-01-25 ENCOUNTER — Emergency Department: Payer: BLUE CROSS/BLUE SHIELD

## 2016-01-25 DIAGNOSIS — K5900 Constipation, unspecified: Secondary | ICD-10-CM | POA: Insufficient documentation

## 2016-01-25 DIAGNOSIS — R1011 Right upper quadrant pain: Secondary | ICD-10-CM | POA: Diagnosis present

## 2016-01-25 DIAGNOSIS — Z79899 Other long term (current) drug therapy: Secondary | ICD-10-CM | POA: Insufficient documentation

## 2016-01-25 DIAGNOSIS — F17200 Nicotine dependence, unspecified, uncomplicated: Secondary | ICD-10-CM | POA: Diagnosis not present

## 2016-01-25 LAB — COMPREHENSIVE METABOLIC PANEL
ALT: 19 U/L (ref 14–54)
AST: 23 U/L (ref 15–41)
Albumin: 3.8 g/dL (ref 3.5–5.0)
Alkaline Phosphatase: 61 U/L (ref 38–126)
Anion gap: 5 (ref 5–15)
BUN: 12 mg/dL (ref 6–20)
CHLORIDE: 108 mmol/L (ref 101–111)
CO2: 24 mmol/L (ref 22–32)
CREATININE: 0.67 mg/dL (ref 0.44–1.00)
Calcium: 8.6 mg/dL — ABNORMAL LOW (ref 8.9–10.3)
GFR calc Af Amer: >60 mL/min (ref >60)
GFR calc non Af Amer: >60 mL/min (ref >60)
Glucose, Bld: 89 mg/dL (ref 65–99)
Potassium: 3.5 mmol/L (ref 3.5–5.1)
SODIUM: 137 mmol/L (ref 135–145)
Total Bilirubin: 0.2 mg/dL — ABNORMAL LOW (ref 0.3–1.2)
Total Protein: 7.1 g/dL (ref 6.5–8.1)

## 2016-01-25 LAB — CBC
HEMATOCRIT: 35.4 % (ref 35.0–47.0)
Hemoglobin: 12.3 g/dL (ref 12.0–16.0)
MCH: 29.7 pg (ref 26.0–34.0)
MCHC: 34.7 g/dL (ref 32.0–36.0)
MCV: 85.7 fL (ref 80.0–100.0)
PLATELETS: 330 10*3/uL (ref 150–440)
RBC: 4.13 MIL/uL (ref 3.80–5.20)
RDW: 12.7 % (ref 11.5–14.5)
WBC: 9.1 10*3/uL (ref 3.6–11.0)

## 2016-01-25 LAB — LIPASE, BLOOD: LIPASE: 29 U/L (ref 11–51)

## 2016-01-25 LAB — TROPONIN I: Troponin I: <0.03 ng/mL (ref <0.03)

## 2016-01-25 MED ORDER — OXYCODONE-ACETAMINOPHEN 5-325 MG PO TABS
1.0000 | ORAL_TABLET | ORAL | Status: DC | PRN
  Administered 2016-01-25: 1 via ORAL
  Filled 2016-01-25: qty 1

## 2016-01-25 NOTE — ED Triage Notes (Signed)
Patient with complaint of right upper abdominal pain radiating into her right shoulder that started yesterday. Patient reports that the pain is worse when taking a deep breath. Patient states that the pain has become worse today. Patient states that she had nausea and vomiting 6 days ago but none since the pain has started. Patient states that she has abdominal bloating and last bowel movement was 2 days ago.

## 2016-01-26 ENCOUNTER — Emergency Department
Admission: EM | Admit: 2016-01-26 | Discharge: 2016-01-26 | Disposition: A | Discharge status: Home / Self Care | Payer: BLUE CROSS/BLUE SHIELD | Attending: Emergency Medicine | Admitting: Emergency Medicine

## 2016-01-26 ENCOUNTER — Emergency Department: Payer: BLUE CROSS/BLUE SHIELD

## 2016-01-26 DIAGNOSIS — K5900 Constipation, unspecified: Secondary | ICD-10-CM

## 2016-01-26 DIAGNOSIS — R1011 Right upper quadrant pain: Secondary | ICD-10-CM

## 2016-01-26 LAB — URINALYSIS, COMPLETE (UACMP) WITH MICROSCOPIC
Bilirubin Urine: NEGATIVE
GLUCOSE, UA: NEGATIVE mg/dL
Ketones, ur: 5 mg/dL — AB
Leukocytes, UA: NEGATIVE
NITRITE: NEGATIVE
PROTEIN: NEGATIVE mg/dL
Specific Gravity, Urine: 1.029 (ref 1.005–1.030)
pH: 6 (ref 5.0–8.0)

## 2016-01-26 LAB — POC URINE PREG, ED: Preg Test, Ur: NEGATIVE

## 2016-01-26 NOTE — ED Notes (Signed)
Dr. Forbach at bedside.  

## 2016-01-26 NOTE — Discharge Instructions (Signed)
You have been seen in the Emergency Department (ED) for abdominal pain.  Your evaluation did not identify a clear cause of your symptoms but was generally reassuring.  Because it is likely your pain is caused by constipation, we recommend that you use one or more of the following over-the-counter medications in the order described:   1)  Colace (or Dulcolax) 100 mg:  This is a stool softener, and you may take it once or twice a day as needed. 2)  Senna tablets:  This is a bowel stimulant that will help "push" out your stool. It is the next step to add after you have tried a stool softener. 3)  Miralax (powder):  This medication works by drawing additional fluid into your intestines and helps to flush out your stool.  Mix the powder with water or juice according to label instructions.  It may help if the Colace and Senna are not sufficient, but you must be sure to use the recommended amount of water or juice when you mix up the powder.  Remember that narcotic pain medications are constipating, so avoid them or minimize their use.  Drink plenty of fluids.  Please follow up as instructed above regarding today?s emergent visit and the symptoms that are bothering you.  Return to the ED if your abdominal pain worsens or fails to improve, you develop bloody vomiting, bloody diarrhea, you are unable to tolerate fluids due to vomiting, fever greater than 101, or other symptoms that concern you.

## 2016-01-26 NOTE — ED Provider Notes (Addendum)
[facility] Emergency Department Provider Note  ____________________________________________   First MD Initiated Contact with Patient 01/26/16 0123     (approximate)  I have reviewed the triage vital signs and the nursing notes.   HISTORY  Chief Complaint Abdominal Pain    HPI Sharon NEIHEISEL is a 26 y.o. female with no chronic medical history who presents for evaluation of right-sided abdominal pain.  No correlation to eating/drinking.  Occasional nausea.  Chronic constipation, although when she does have a BM it is frequently soft.  Pain acute worse over the last day.  Pain radiates from RUQ to right shoulder.  No hx of gallbladder disease.  Severe at ts worst, currently mild.  Denies fever/chills, chest pain, shortness of breath, vomiting, dysuria.  She is currently on her menstrual period.   History reviewed. No pertinent past medical history.  There are no active problems to display for this patient.   Past Surgical History:  Procedure Laterality Date  . NO PAST SURGERIES      Prior to Admission medications   Medication Sig Start Date End Date Taking? Authorizing Provider  acetaminophen (TYLENOL) 500 MG tablet Take 2 tablets (1,000 mg total) by mouth every 6 (six) hours as needed for mild pain, moderate pain or headache. 05/30/15   Olen Cordial, NP  fluticasone (FLONASE) 50 MCG/ACT nasal spray Place 1 spray into both nostrils 2 (two) times daily. 05/30/15   Olen Cordial, NP  norgestimate-ethinyl estradiol (SPRINTEC 28) 0.25-35 MG-MCG tablet Take 1 tablet by mouth daily. 11/08/15   Frederich Cha, MD    Allergies Patient has no known allergies.  No family history on file.  Social History Social History  Substance Use Topics  . Smoking status: Current Some Day Smoker  . Smokeless tobacco: Never Used  . Alcohol use Yes     Comment: occasionally    Review of Systems Constitutional: No fever/chills Eyes: No visual changes. ENT:  No sore throat. Cardiovascular: Denies chest pain. Respiratory: Denies shortness of breath. Gastrointestinal: right upper quadrant pain with nausea, no vomiting. +constipation Genitourinary: Negative for dysuria. Musculoskeletal: Negative for back pain. Skin: Negative for rash. Neurological: Negative for headaches, focal weakness or numbness.  10-point ROS otherwise negative.  ____________________________________________   PHYSICAL EXAM:  VITAL SIGNS: ED Triage Vitals  Enc Vitals Group     BP 01/25/16 2011 121/80     Pulse Rate 01/25/16 2011 80     Resp 01/25/16 2011 18     Temp 01/25/16 2011 98.6 F (37 C)     Temp Source 01/25/16 2011 Oral     SpO2 01/25/16 2011 100 %     Weight 01/25/16 2011 118 lb (53.5 kg)     Height 01/25/16 2011 4\' 11"  (1.499 m)     Head Circumference --      Peak Flow --      Pain Score 01/25/16 2012 8     Pain Loc --      Pain Edu? --      Excl. in [place]? --     Constitutional: Alert and oriented. Well appearing and in no acute distress. Eyes: Conjunctivae are normal. PERRL. EOMI. Head: Atraumatic. Nose: No congestion/rhinnorhea. Mouth/Throat: Mucous membranes are moist.  Oropharynx non-erythematous. Neck: No stridor.  No meningeal signs.   Cardiovascular: Normal rate, regular rhythm. Good peripheral circulation. Grossly normal heart sounds. Respiratory: Normal respiratory effort.  No retractions. Lungs CTAB. Gastrointestinal: Soft and nontender. No distention. the patient repots some very  mild tenderness to palpation of the right upper quadrant but she has a negative Murphy sign.  She has no lower abdominal tenderness.  No rebound nor guarding. Musculoskeletal: No lower extremity tenderness nor edema. No gross deformities of extremities. Neurologic:  Normal speech and language. No gross focal neurologic deficits are appreciated.  Skin:  Skin is warm, dry and intact. No rash noted. Psychiatric: Mood and affect are normal. Speech and behavior are  normal.  ____________________________________________   LABS (all labs ordered are listed, but only abnormal results are displayed)  Labs Reviewed  COMPREHENSIVE METABOLIC PANEL - Abnormal; Notable for the following:       Result Value   Calcium 8.6 (*)    Total Bilirubin 0.2 (*)    All other components within normal limits  URINALYSIS, COMPLETE (UACMP) WITH MICROSCOPIC - Abnormal; Notable for the following:    Color, Urine YELLOW (*)    APPearance CLEAR (*)    Hgb urine dipstick MODERATE (*)    Ketones, ur 5 (*)    Bacteria, UA RARE (*)    Squamous Epithelial / LPF 6-30 (*)    All other components within normal limits  LIPASE, BLOOD  CBC  TROPONIN I  POC URINE PREG, ED   ____________________________________________  EKG  ED ECG REPORT I, Zadaya Cuadra, the attending physician, personally viewed and interpreted this ECG.  Date: 01/25/2016 EKG Time: 20:17 Rate: 88 Rhythm: normal sinus rhythm QRS Axis: normal Intervals: normal ST/T Wave abnormalities: normal Conduction Disturbances: none Narrative Interpretation: unremarkable  ____________________________________________  RADIOLOGY   Dg Chest 2 View  Result Date: 01/25/2016 CLINICAL DATA:  Right upper abdominal pain radiating to the shoulder, onset yesterday. The pain is pleuritic. EXAM: CHEST  2 VIEW COMPARISON:  None. FINDINGS: The heart size and mediastinal contours are within normal limits. Both lungs are clear. The visualized skeletal structures are unremarkable. IMPRESSION: No active cardiopulmonary disease. Electronically Signed   By: Andreas Newport M.D.   On: 01/25/2016 21:04   US Abdomen Limited Ruq  Result Date: 01/26/2016 CLINICAL DATA:  Right upper quadrant pain radiating to the shoulder for 2 or 3 days. EXAM: US ABDOMEN LIMITED - RIGHT UPPER QUADRANT COMPARISON:  None. FINDINGS: Gallbladder: No gallstones or wall thickening visualized. No sonographic Murphy sign noted by sonographer. Common bile duct:  Diameter: 2.5 mm Liver: No focal lesion identified. Within normal limits in parenchymal echogenicity. IMPRESSION: Normal liver, gallbladder and bile ducts. Electronically Signed   By: Andreas Newport M.D.   On: 01/26/2016 01:58    ____________________________________________   PROCEDURES  Procedure(s) performed:   Procedures   Critical Care performed: No ____________________________________________   INITIAL IMPRESSION / ASSESSMENT AND PLAN / ED COURSE  Pertinent labs & imaging results that were available during my care of the patient were reviewed by me and considered in my medical decision making (see chart for details).  workup has been reassuring.  Initially I thought gallbladder disease but her ultrasound is unremarkable.  After talking with her further I believe her pain is the result of constipation.  The patient and her mother agree this is likely.  I gave my usual and customary undifferentiated abdominal pain discussion and recommended outpatient treatment for constipation as well as ibuprofen and Tylenol.  She and her mderstand and agree with the plan.   ____________________________________________  FINAL CLINICAL IMPRESSION(S) / ED DIAGNOSES  Final diagnoses:  Right upper quadrant abdominal pain  Constipation, unspecified constipation type     MEDICATIONS GIVEN DURING THIS VISIT:  Medications  oxyCODONE-acetaminophen (PERCOCET/ROXICET) 5-325 MG per tablet 1 tablet (1 tablet Oral Given 01/25/16 2031)     NEW OUTPATIENT MEDICATIONS STARTED DURING THIS VISIT:  New Prescriptions   No medications on file    Modified Medications   No medications on file    Discontinued Medications   No medications on file     Note:  This document was prepared using Dragon voice recognition software and may include unintentional dictation errors.    Hinda Kehr, MD 01/26/16 [place], MD 01/26/16 [phone]

## 2016-01-26 NOTE — ED Notes (Signed)
Pt taken to US

## 2016-08-06 DIAGNOSIS — A630 Anogenital (venereal) warts: Secondary | ICD-10-CM | POA: Insufficient documentation

## 2017-04-18 ENCOUNTER — Encounter: Payer: Self-pay | Admitting: Emergency Medicine

## 2017-04-18 ENCOUNTER — Other Ambulatory Visit: Payer: Self-pay

## 2017-04-18 ENCOUNTER — Emergency Department: Payer: BLUE CROSS/BLUE SHIELD

## 2017-04-18 ENCOUNTER — Emergency Department
Admission: EM | Admit: 2017-04-18 | Discharge: 2017-04-18 | Disposition: A | Discharge status: Home / Self Care | Payer: BLUE CROSS/BLUE SHIELD | Attending: Emergency Medicine | Admitting: Emergency Medicine

## 2017-04-18 DIAGNOSIS — O26899 Other specified pregnancy related conditions, unspecified trimester: Secondary | ICD-10-CM

## 2017-04-18 DIAGNOSIS — R102 Pelvic and perineal pain: Secondary | ICD-10-CM | POA: Diagnosis not present

## 2017-04-18 DIAGNOSIS — O9989 Other specified diseases and conditions complicating pregnancy, childbirth and the puerperium: Secondary | ICD-10-CM | POA: Diagnosis not present

## 2017-04-18 DIAGNOSIS — Z3491 Encounter for supervision of normal pregnancy, unspecified, first trimester: Secondary | ICD-10-CM

## 2017-04-18 DIAGNOSIS — Z87891 Personal history of nicotine dependence: Secondary | ICD-10-CM | POA: Insufficient documentation

## 2017-04-18 DIAGNOSIS — R109 Unspecified abdominal pain: Secondary | ICD-10-CM

## 2017-04-18 DIAGNOSIS — Z79899 Other long term (current) drug therapy: Secondary | ICD-10-CM | POA: Diagnosis not present

## 2017-04-18 DIAGNOSIS — Z3A01 Less than 8 weeks gestation of pregnancy: Secondary | ICD-10-CM | POA: Insufficient documentation

## 2017-04-18 LAB — URINALYSIS, COMPLETE (UACMP) WITH MICROSCOPIC
BACTERIA UA: NONE SEEN
BILIRUBIN URINE: NEGATIVE
Glucose, UA: NEGATIVE mg/dL
Hgb urine dipstick: NEGATIVE
KETONES UR: 20 mg/dL — AB
LEUKOCYTES UA: NEGATIVE
Nitrite: NEGATIVE
Protein, ur: NEGATIVE mg/dL
Specific Gravity, Urine: 1.025 (ref 1.005–1.030)
pH: 6 (ref 5.0–8.0)

## 2017-04-18 LAB — WET PREP, GENITAL
Clue Cells Wet Prep HPF POC: NONE SEEN
Sperm: NONE SEEN
Trich, Wet Prep: NONE SEEN
Yeast Wet Prep HPF POC: NONE SEEN

## 2017-04-18 LAB — CBC
HEMATOCRIT: 38.9 % (ref 35.0–47.0)
Hemoglobin: 13.3 g/dL (ref 12.0–16.0)
MCH: 29.5 pg (ref 26.0–34.0)
MCHC: 34 g/dL (ref 32.0–36.0)
MCV: 86.6 fL (ref 80.0–100.0)
Platelets: 359 10*3/uL (ref 150–440)
RBC: 4.5 MIL/uL (ref 3.80–5.20)
RDW: 13.1 % (ref 11.5–14.5)
WBC: 6.9 10*3/uL (ref 3.6–11.0)

## 2017-04-18 LAB — COMPREHENSIVE METABOLIC PANEL
ALT: 16 U/L (ref 14–54)
AST: 19 U/L (ref 15–41)
Albumin: 4.4 g/dL (ref 3.5–5.0)
Alkaline Phosphatase: 57 U/L (ref 38–126)
Anion gap: 9 (ref 5–15)
BUN: 10 mg/dL (ref 6–20)
CHLORIDE: 106 mmol/L (ref 101–111)
CO2: 21 mmol/L — ABNORMAL LOW (ref 22–32)
Calcium: 8.9 mg/dL (ref 8.9–10.3)
Creatinine, Ser: 0.73 mg/dL (ref 0.44–1.00)
Glucose, Bld: 87 mg/dL (ref 65–99)
POTASSIUM: 3.4 mmol/L — AB (ref 3.5–5.1)
Sodium: 136 mmol/L (ref 135–145)
Total Bilirubin: 0.5 mg/dL (ref 0.3–1.2)
Total Protein: 7.6 g/dL (ref 6.5–8.1)

## 2017-04-18 LAB — CHLAMYDIA/NGC RT PCR (ARMC ONLY)
CHLAMYDIA TR: NOT DETECTED
N GONORRHOEAE: NOT DETECTED

## 2017-04-18 LAB — HCG, QUANTITATIVE, PREGNANCY: hCG, Beta Chain, Quant, S: 5071 m[IU]/mL — ABNORMAL HIGH (ref <5)

## 2017-04-18 LAB — ABO/RH: ABO/RH(D): A POS

## 2017-04-18 NOTE — ED Notes (Signed)
ED Provider at bedside. 

## 2017-04-18 NOTE — Discharge Instructions (Addendum)
Please follow up closely with obstetrics and gynecology or your primary doctor.  Return to the emergency room if your symptoms worsen, you become weak and dizzy or lightheaded, you have an episode of passing out, develop bleeding, develop abdominal or pelvic pain, fevers chills or other new concerns arise.

## 2017-04-18 NOTE — ED Notes (Signed)
Patient transported to US 

## 2017-04-18 NOTE — ED Provider Notes (Signed)
[facility] Emergency Department Provider Note   ____________________________________________   First MD Initiated Contact with Patient 04/18/17 0719     (approximate)  I have reviewed the triage vital signs and the nursing notes.   HISTORY  Chief Complaint Abdominal Pain    HPI Sharon Soto is a 27 y.o. female presents for evaluation for pregnancy  Patient reports she is taken 3 home pregnancy tests, all positive.  She takes no medication is not any birth control.  She reports that her last menstrual cycle was about February 11.  She reports the last 3 days she is noticed a slight achy discomfort or feeling of a slight cramping in her lower abdomen at times.  No nausea or vomiting.  Denies being in any pain now.  Symptoms will occur last night and have since gone away.  He has not seen any bleeding but has had a slight change in her vaginal mucosa, describes a slight white vaginal discharge.  Takes no medications.  Denies previous pregnancy.  Denies use of fertility treatments.  No headaches.  No nausea.  No diarrhea.  No constipation.  No dysuria or pain or discomfort with urination.  History reviewed. No pertinent past medical history.  There are no active problems to display for this patient.   Past Surgical History:  Procedure Laterality Date  . NO PAST SURGERIES      Prior to Admission medications   Medication Sig Start Date End Date Taking? Authorizing Provider  acetaminophen (TYLENOL) 500 MG tablet Take 2 tablets (1,000 mg total) by mouth every 6 (six) hours as needed for mild pain, moderate pain or headache. 05/30/15   Betancourt, Aura Fey, NP  fluticasone (FLONASE) 50 MCG/ACT nasal spray Place 1 spray into both nostrils 2 (two) times daily. 05/30/15   Betancourt, Aura Fey, NP  norgestimate-ethinyl estradiol (SPRINTEC 28) 0.25-35 MG-MCG tablet Take 1 tablet by mouth daily. 11/08/15   Frederich Cha, MD    Allergies Patient has no known  allergies.  No family history on file.  Social History Social History   Tobacco Use  . Smoking status: Former Research scientist (life sciences)  . Smokeless tobacco: Never Used  Substance Use Topics  . Alcohol use: Yes    Comment: occasionally  . Drug use: No    Review of Systems Constitutional: No fever/chills Eyes: No visual changes. ENT: No sore throat. Cardiovascular: Denies chest pain. Respiratory: Denies shortness of breath. Gastrointestinal:  No nausea, no vomiting.  No diarrhea.  No constipation. Genitourinary: Negative for dysuria. Musculoskeletal: Negative for back pain. Skin: Negative for rash. Neurological: Negative for headaches, focal weakness or numbness.    ____________________________________________   PHYSICAL EXAM:  VITAL SIGNS: ED Triage Vitals  Enc Vitals Group     BP 04/18/17 0714 121/67     Pulse Rate 04/18/17 0714 75     Resp 04/18/17 0714 18     Temp 04/18/17 0714 98.2 F (36.8 C)     Temp Source 04/18/17 0714 Oral     SpO2 04/18/17 0714 100 %     Weight 04/18/17 0714 125 lb (56.7 kg)     Height 04/18/17 0714 5' (1.524 m)     Head Circumference --      Peak Flow --      Pain Score 04/18/17 0713 6     Pain Loc --      Pain Edu? --      Excl. in [place]? --     Constitutional: Alert and  oriented. Well appearing and in no acute distress. Eyes: Conjunctivae are normal. Head: Atraumatic. Nose: No congestion/rhinnorhea. Mouth/Throat: Mucous membranes are moist. Neck: No stridor.   Cardiovascular: Normal rate, regular rhythm. Grossly normal heart sounds.  Good peripheral circulation. Respiratory: Normal respiratory effort.  No retractions. Lungs CTAB. Gastrointestinal: Soft and nontender. No distention.  Not obviously gravid.  There is no tenderness to palpation at present.  No rebound or guarding in any quadrant. GU: Escorted by RN, normal external exam.  Internal exam demonstrates slight whitish discharge.  No abnormal odor.  No tenderness.  No erythema.  No  bleeding.  The office is closed. Musculoskeletal: No lower extremity tenderness nor edema. Neurologic:  Normal speech and language. No gross focal neurologic deficits are appreciated.  Skin:  Skin is warm, dry and intact. No rash noted. Psychiatric: Mood and affect are normal. Speech and behavior are normal.  ____________________________________________   LABS (all labs ordered are listed, but only abnormal results are displayed)  Labs Reviewed  WET PREP, GENITAL - Abnormal; Notable for the following components:      Result Value   WBC, Wet Prep HPF POC FEW (*)    All other components within normal limits  COMPREHENSIVE METABOLIC PANEL - Abnormal; Notable for the following components:   Potassium 3.4 (*)    CO2 21 (*)    All other components within normal limits  URINALYSIS, COMPLETE (UACMP) WITH MICROSCOPIC - Abnormal; Notable for the following components:   Color, Urine YELLOW (*)    APPearance HAZY (*)    Ketones, ur 20 (*)    Squamous Epithelial / LPF 0-5 (*)    All other components within normal limits  HCG, QUANTITATIVE, PREGNANCY - Abnormal; Notable for the following components:   hCG, Beta Chain, Quant, S 5,071 (*)    All other components within normal limits  CHLAMYDIA/NGC RT PCR ([facility] ONLY)  CBC  ABO/RH   ____________________________________________  EKG   ____________________________________________  RADIOLOGY    Ultrasound reviewed, single intrauterine pregnancy with gestational sac and yolk sac.  No fetal pole is yet visualized. ____________________________________________   PROCEDURES  Procedure(s) performed: None  Procedures  Critical Care performed: No  ____________________________________________   INITIAL IMPRESSION / ASSESSMENT AND PLAN / ED COURSE  Pertinent labs & imaging results that were available during my care of the patient were reviewed by me and considered in my medical decision making (see chart for  details).  Patient presents for evaluation in early pregnancy.  Had some slight cramping yesterday.  Denies any bleeding or passage of any clots, but has noticed a slight whitish discharge for the last few days as well.  Afebrile stable hemodynamics.  Very reassuring clinical exam with no evidence of ongoing pain or suspicion of ectopic based on my clinical exam, however will obtain hCG, transvaginal ultrasound as well as perform gynecologic examination to evaluate for possible discharge and assure no evidence of any bleeding.  Patient denies having seen any bleeding.  Last menstrual cycle about February 11, we would estimate for ultrasound that the patient will be roughly 6-1/[redacted] weeks pregnant.    Discussed with patient, careful follow-up.  No signs or symptoms of acute illness.  Patient asymptomatic at this time no ongoing pain or discomfort.  She will follow-up closely with Dr. Marcelline M[facility] at [facility] within the next few days to week.  Careful return precautions advised.  Patient will start on a prenatal vitamin as well.  Return precautions and treatment recommendations and follow-up discussed with the  patient who is agreeable with the plan.   ____________________________________________   FINAL CLINICAL IMPRESSION(S) / ED DIAGNOSES  Final diagnoses:  First trimester pregnancy      NEW MEDICATIONS STARTED DURING THIS VISIT:  Discharge Medication List as of 04/18/2017 10:57 AM       Note:  This document was prepared using Dragon voice recognition software and may include unintentional dictation errors.     Delman Kitten, MD 04/18/17 [phone]

## 2017-04-18 NOTE — ED Triage Notes (Signed)
C/O lower abdominal cramping x 3 days.  States she has taken three home pregnancy tests at home and "just wants to confirm the pregnancy".  LMP:  2/11.  Denies dysuria.  Denies Vaginal bleeding.

## 2017-04-18 NOTE — ED Notes (Signed)
Pt updated on delay. Significant other at bedside.

## 2017-04-23 ENCOUNTER — Ambulatory Visit (INDEPENDENT_AMBULATORY_CARE_PROVIDER_SITE_OTHER): Payer: BLUE CROSS/BLUE SHIELD | Admitting: Advanced Practice Midwife

## 2017-04-23 ENCOUNTER — Encounter: Payer: Self-pay | Admitting: Advanced Practice Midwife

## 2017-04-23 VITALS — BP 96/58 | HR 73 | Wt 138.0 lb

## 2017-04-23 DIAGNOSIS — Z113 Encounter for screening for infections with a predominantly sexual mode of transmission: Secondary | ICD-10-CM

## 2017-04-23 DIAGNOSIS — Z3400 Encounter for supervision of normal first pregnancy, unspecified trimester: Secondary | ICD-10-CM | POA: Insufficient documentation

## 2017-04-23 DIAGNOSIS — Z124 Encounter for screening for malignant neoplasm of cervix: Secondary | ICD-10-CM

## 2017-04-23 DIAGNOSIS — Z1379 Encounter for other screening for genetic and chromosomal anomalies: Secondary | ICD-10-CM

## 2017-04-23 LAB — OB RESULTS CONSOLE VARICELLA ZOSTER ANTIBODY, IGG: Varicella: IMMUNE

## 2017-04-23 NOTE — Patient Instructions (Signed)
Prenatal Care WHAT IS PRENATAL CARE? Prenatal care is the process of caring for a pregnant woman before she gives birth. Prenatal care makes sure that she and her baby remain as healthy as possible throughout pregnancy. Prenatal care may be provided by a midwife, family practice health care provider, or a childbirth and pregnancy specialist (obstetrician). Prenatal care may include physical examinations, testing, treatments, and education on nutrition, lifestyle, and social support services. WHY IS PRENATAL CARE SO IMPORTANT? Early and consistent prenatal care increases the chance that you and your baby will remain healthy throughout your pregnancy. This type of care also decreases a baby's risk of being born too early (prematurely), or being born smaller than expected (small for gestational age). Any underlying medical conditions you may have that could pose a risk during your pregnancy are discussed during prenatal care visits. You will also be monitored regularly for any new conditions that may arise during your pregnancy so they can be treated quickly and effectively. WHAT HAPPENS DURING PRENATAL CARE VISITS? Prenatal care visits may include the following: Discussion Tell your health care provider about any new signs or symptoms you have experienced since your last visit. These might include:  Nausea or vomiting.  Increased or decreased level of energy.  Difficulty sleeping.  Back or leg pain.  Weight changes.  Frequent urination.  Shortness of breath with physical activity.  Changes in your skin, such as the development of a rash or itchiness.  Vaginal discharge or bleeding.  Feelings of excitement or nervousness.  Changes in your baby's movements.  You may want to write down any questions or topics you want to discuss with your health care provider and bring them with you to your appointment. Examination During your first prenatal care visit, you will likely have a complete  physical exam. Your health care provider will often examine your vagina, cervix, and the position of your uterus, as well as check your heart, lungs, and other body systems. As your pregnancy progresses, your health care provider will measure the size of your uterus and your baby's position inside your uterus. He or she may also examine you for early signs of labor. Your prenatal visits may also include checking your blood pressure and, after about 10-12 weeks of pregnancy, listening to your baby's heartbeat. Testing Regular testing often includes:  Urinalysis. This checks your urine for glucose, protein, or signs of infection.  Blood count. This checks the levels of white and red blood cells in your body.  Tests for sexually transmitted infections (STIs). Testing for STIs at the beginning of pregnancy is routinely done and is required in many states.  Antibody testing. You will be checked to see if you are immune to certain illnesses, such as rubella, that can affect a developing fetus.  Glucose screen. Around 24-28 weeks of pregnancy, your blood glucose level will be checked for signs of gestational diabetes. Follow-up tests may be recommended.  Group B strep. This is a bacteria that is commonly found inside a woman's vagina. This test will inform your health care provider if you need an antibiotic to reduce the amount of this bacteria in your body prior to labor and childbirth.  Ultrasound. Many pregnant women undergo an ultrasound screening around 18-20 weeks of pregnancy to evaluate the health of the fetus and check for any developmental abnormalities.  HIV (human immunodeficiency virus) testing. Early in your pregnancy, you will be screened for HIV. If you are at high risk for HIV, this test may   be repeated during your third trimester of pregnancy.  You may be offered other testing based on your age, personal or family medical history, or other factors. HOW OFTEN SHOULD I PLAN TO SEE MY  HEALTH CARE PROVIDER FOR PRENATAL CARE? Your prenatal care check-up schedule depends on any medical conditions you have before, or develop during, your pregnancy. If you do not have any underlying medical conditions, you will likely be seen for checkups:  Monthly, during the first 6 months of pregnancy.  Twice a month during months 7 and 8 of pregnancy.  Weekly starting in the 9th month of pregnancy and until delivery.  If you develop signs of early labor or other concerning signs or symptoms, you may need to see your health care provider more often. Ask your health care provider what prenatal care schedule is best for you. WHAT CAN I DO TO KEEP MYSELF AND MY BABY AS HEALTHY AS POSSIBLE DURING MY PREGNANCY?  Take a prenatal vitamin containing 400 micrograms (0.4 mg) of folic acid every day. Your health care provider may also ask you to take additional vitamins such as iodine, vitamin D, iron, copper, and zinc.  Take 1500-2000 mg of calcium daily starting at your 20th week of pregnancy until you deliver your baby.  Make sure you are up to date on your vaccinations. Unless directed otherwise by your health care provider: ? You should receive a tetanus, diphtheria, and pertussis (Tdap) vaccination between the 27th and 36th week of your pregnancy, regardless of when your last Tdap immunization occurred. This helps protect your baby from whooping cough (pertussis) after he or she is born. ? You should receive an annual inactivated influenza vaccine (IIV) to help protect you and your baby from influenza. This can be done at any point during your pregnancy.  Eat a well-rounded diet that includes: ? Fresh fruits and vegetables. ? Lean proteins. ? Calcium-rich foods such as milk, yogurt, hard cheeses, and dark, leafy greens. ? Whole grain breads.  Do noteat seafood high in mercury, including: ? Swordfish. ? Tilefish. ? Shark. ? King mackerel. ? More than 6 oz tuna per week.  Do not  eat: ? Raw or undercooked meats or eggs. ? Unpasteurized foods, such as soft cheeses (brie, blue, or feta), juices, and milks. ? Lunch meats. ? Hot dogs that have not been heated until they are steaming.  Drink enough water to keep your urine clear or pale yellow. For many women, this may be 10 or more 8 oz glasses of water each day. Keeping yourself hydrated helps deliver nutrients to your baby and may prevent the start of pre-term uterine contractions.  Do not use any tobacco products including cigarettes, chewing tobacco, or electronic cigarettes. If you need help quitting, ask your health care provider.  Do not drink beverages containing alcohol. No safe level of alcohol consumption during pregnancy has been determined.  Do not use any illegal drugs. These can harm your developing baby or cause a miscarriage.  Ask your health care provider or pharmacist before taking any prescription or over-the-counter medicines, herbs, or supplements.  Limit your caffeine intake to no more than 200 mg per day.  Exercise. Unless told otherwise by your health care provider, try to get 30 minutes of moderate exercise most days of the week. Do not  do high-impact activities, contact sports, or activities with a high risk of falling, such as horseback riding or downhill skiing.  Get plenty of rest.  Avoid anything that raises your   body temperature, such as hot tubs and saunas.  If you own a cat, do not empty its litter box. Bacteria contained in cat feces can cause an infection called toxoplasmosis. This can result in serious harm to the fetus.  Stay away from chemicals such as insecticides, lead, mercury, and cleaning or paint products that contain solvents.  Do not have any X-rays taken unless medically necessary.  Take a childbirth and breastfeeding preparation class. Ask your health care provider if you need a referral or recommendation.  This information is not intended to replace advice given  to you by your health care provider. Make sure you discuss any questions you have with your health care provider. Document Released: 01/09/2003 Document Revised: 06/11/2015 Document Reviewed: 03/23/2013 Else[employer] Interactive Patient Education  2017 [employer] Exercise During Pregnancy For people of all ages, exercise is an important part of being healthy. Exercise improves heart and lung function and helps to maintain strength, flexibility, and a healthy body weight. Exercise also boosts energy levels and elevates mood. For most women, maintaining an exercise routine throughout pregnancy is recommended. It is only on rare occasions and with certain medical conditions or pregnancy complications that women may be asked to limit or avoid exercise during pregnancy. What are some other benefits to exercising during pregnancy? Along with maintaining strength and flexibility, exercising throughout pregnancy can help to:  Keep strength in muscles that are very important during labor and childbirth.  Decrease low back pain during pregnancy.  Decrease the risk of developing gestational diabetes mellitus (GDM).  Improve blood sugar (glucose) control for women who have GDM.  Decrease the risk of developing preeclampsia. This is a serious condition that causes high blood pressure along with other symptoms, such as swelling and headaches.  Decrease the risk of cesarean delivery.  Speed up the recovery after giving birth.  How often should I exercise? Unless your health care provider gives you different instructions, you should try to exercise on most days or all days of the week. In general, try to exercise with moderate intensity for about 150 minutes per week. This can be spread out across several days, such as exercising for 30 minutes per day on 5 days of each week. You can tell that you are exercising at a moderate intensity if you have a higher heart rate and faster breathing, but you are still  able to hold a conversation. What types of moderate-intensity exercise are recommended during pregnancy? There are many types of exercise that are safe for you to do during pregnancy. Unless your health care provider gives you different instructions, do a variety of exercises that safely increase your heart and breathing (cardiopulmonary) rates and help you to build and maintain muscle strength (strength training). You should always be able to talk in full sentences while exercising during pregnancy. Some examples of exercising that is safe to do during pregnancy include:  Brisk walking or hiking.  Swimming.  Water aerobics.  Riding a stationary bike.  Strength training.  Modified yoga or Pilates. Tell your instructor that you are pregnant. Avoid overstretching and avoid lying on your back for long periods of time.  Running or jogging. Only choose this type of exercise if: ? You ran or jogged regularly before your pregnancy. ? You can run or jog and still talk in complete sentences.  What types of exercise should I not do during pregnancy? Depending on your level of fitness and whether you exercised regularly before your pregnancy, you may be   advised to limit vigorous-intensity exercise during your pregnancy. You can tell that you are exercising at a vigorous intensity if you are breathing much harder and faster and cannot hold a conversation while exercising. Some examples of exercising that you should avoid during pregnancy include:  Contact sports.  Activities that place you at risk for falling on or being hit in the belly, such as downhill skiing, water skiing, surfing, rock climbing, cycling, gymnastics, and horseback riding.  Scuba diving.  Sky diving.  Yoga or Pilates in a room that is heated to extreme temperatures ("hot yoga" or "hot Pilates").  Jogging or running, unless you ran or jogged regularly before your pregnancy. While jogging or running, you should always be able  to talk in full sentences. Do not run or jog so vigorously that you are unable to have a conversation.  If you are not used to exercising at elevation (more than 6,000 feet above sea level), do not do so during your pregnancy.  When should I avoid exercising during pregnancy? Certain medical conditions can make it unsafe to exercise during pregnancy, or they may increase your risk of miscarriage or early labor and birth. Some of these conditions include:  Some types of heart disease.  Some types of lung disease.  Placenta previa. This is when the placenta partially or completely covers the opening of the uterus (cervix).  Frequent bleeding from the vagina during your pregnancy.  Incompetent cervix. This is when your cervix does not remain as tightly closed during pregnancy as it should.  Premature labor.  Ruptured membranes. This is when the protective sac (amniotic sac) opens up and amniotic fluid leaks from your vagina.  Severely low blood count (anemia).  Preeclampsia or pregnancy-caused high blood pressure.  Carrying more than one baby (multiple gestation) and having an additional risk of early labor.  Poorly controlled diabetes.  Being severely underweight or severely overweight.  Intrauterine growth restriction. This is when your baby's growth and development during pregnancy are slower than expected.  Other medical conditions. Ask your health care provider if any apply to you.  What else should I know about exercising during pregnancy? You should take these precautions while exercising during pregnancy:  Avoid overheating. ? Wear loose-fitting, breathable clothes. ? Do not exercise in very high temperatures.  Avoid dehydration. Drink enough water before, during, and after exercise to keep your urine clear or pale yellow.  Avoid overstretching. Because of hormone changes during pregnancy, it is easy to overstretch muscles, tendons, and ligaments during  pregnancy.  Start slowly and ask your health care provider to recommend types of exercise that are safe for you, if exercising regularly is new for you.  Pregnancy is not a time for exercising to lose weight. When should I seek medical care? You should stop exercising and call your health care provider if you have any unusual symptoms, such as:  Mild uterine contractions or abdominal cramping.  Dizziness that does not improve with rest.  When should I seek immediate medical care? You should stop exercising and call your local emergency services (911 in the [place] if you have any unusual symptoms, such as:  Sudden, severe pain in your low back or your belly.  Uterine contractions or abdominal cramping that do not improve with rest.  Chest pain.  Bleeding or fluid leaking from your vagina.  Shortness of breath.  This information is not intended to replace advice given to you by your health care provider. Make sure you discuss any   questions you have with your health care provider. Document Released: 01/06/2005 Document Revised: 06/06/2015 Document Reviewed: 03/16/2014 [employer] Interactive Patient Education  2018 [employer] Eating Plan for Pregnant Women While you are pregnant, your body will require additional nutrition to help support your growing baby. It is recommended that you consume:  150 additional calories each day during your first trimester.  300 additional calories each day during your second trimester.  300 additional calories each day during your third trimester.  Eating a healthy, well-balanced diet is very important for your health and for your baby's health. You also have a higher need for some vitamins and minerals, such as folic acid, calcium, iron, and vitamin D. What do I need to know about eating during pregnancy?  Do not try to lose weight or go on a diet during pregnancy.  Choose healthy, nutritious foods. Choose  of a sandwich with a glass of milk  instead of a candy bar or a high-calorie sugar-sweetened beverage.  Limit your overall intake of foods that have "empty calories." These are foods that have little nutritional value, such as sweets, desserts, candies, sugar-sweetened beverages, and fried foods.  Eat a variety of foods, especially fruits and vegetables.  Take a prenatal vitamin to help meet the additional needs during pregnancy, specifically for folic acid, iron, calcium, and vitamin D.  Remember to stay active. Ask your health care provider for exercise recommendations that are specific to you.  Practice good food safety and cleanliness, such as washing your hands before you eat and after you prepare raw meat. This helps to prevent foodborne illnesses, such as listeriosis, that can be very dangerous for your baby. Ask your health care provider for more information about listeriosis. What does 150 extra calories look like? Healthy options for an additional 150 calories each day could be any of the following:  Plain low-fat yogurt (6-8 oz) with  cup of berries.  1 apple with 2 teaspoons of peanut butter.  Cut-up vegetables with  cup of hummus.  Low-fat chocolate milk (8 oz or 1 cup).  1 string cheese with 1 medium orange.   of a peanut butter and jelly sandwich on whole-wheat bread (1 tsp of peanut butter).  For 300 calories, you could eat two of those healthy options each day. What is a healthy amount of weight to gain? The recommended amount of weight for you to gain is based on your pre-pregnancy BMI. If your pre-pregnancy BMI was:  Less than 18 (underweight), you should gain 28-40 lb.  18-24.9 (normal), you should gain 25-35 lb.  25-29.9 (overweight), you should gain 15-25 lb.  Greater than 30 (obese), you should gain 11-20 lb.  What if I am having twins or multiples? Generally, pregnant women who will be having twins or multiples may need to increase their daily calories by 300-600 calories each day. The  recommended range for total weight gain is 25-54 lb, depending on your pre-pregnancy BMI. Talk with your health care provider for specific guidance about additional nutritional needs, weight gain, and exercise during your pregnancy. What foods can I eat? Grains Any grains. Try to choose whole grains, such as whole-wheat bread, oatmeal, or brown rice. Vegetables Any vegetables. Try to eat a variety of colors and types of vegetables to get a full range of vitamins and minerals. Remember to wash your vegetables well before eating. Fruits Any fruits. Try to eat a variety of colors and types of fruit to get a full range of vitamins and   minerals. Remember to wash your fruits well before eating. Meats and Other Protein Sources Lean meats, including chicken, turkey, fish, and lean cuts of beef, veal, or pork. Make sure that all meats are cooked to "well done." Tofu. Tempeh. Beans. Eggs. Peanut butter and other nut butters. Seafood, such as shrimp, crab, and lobster. If you choose fish, select types that are higher in omega-3 fatty acids, including salmon, herring, mussels, trout, sardines, and pollock. Make sure that all meats are cooked to food-safe temperatures. Dairy Pasteurized milk and milk alternatives. Pasteurized yogurt and pasteurized cheese. Cottage cheese. Sour cream. Beverages Water. Juices that contain 100% fruit juice or vegetable juice. Caffeine-free teas and decaffeinated coffee. Drinks that contain caffeine are okay to drink, but it is better to avoid caffeine. Keep your total caffeine intake to less than 200 mg each day (12 oz of coffee, tea, or soda) or as directed by your health care provider. Condiments Any pasteurized condiments. Sweets and Desserts Any sweets and desserts. Fats and Oils Any fats and oils. The items listed above may not be a complete list of recommended foods or beverages. Contact your dietitian for more options. What foods are not  recommended? Vegetables Unpasteurized (raw) vegetable juices. Fruits Unpasteurized (raw) fruit juices. Meats and Other Protein Sources Cured meats that have nitrates, such as bacon, salami, and hotdogs. Luncheon meats, bologna, or other deli meats (unless they are reheated until they are steaming hot). Refrigerated pate, meat spreads from a meat counter, smoked seafood that is found in the refrigerated section of a store. Raw fish, such as sushi or sashimi. High mercury content fish, such as tilefish, shark, swordfish, and king mackerel. Raw meats, such as tuna or beef tartare. Undercooked meats and poultry. Make sure that all meats are cooked to food-safe temperatures. Dairy Unpasteurized (raw) milk and any foods that have raw milk in them. Soft cheeses, such as feta, queso blanco, queso fresco, Brie, Camembert cheeses, blue-veined cheeses, and Panela cheese (unless it is made with pasteurized milk, which must be stated on the label). Beverages Alcohol. Sugar-sweetened beverages, such as sodas, teas, or energy drinks. Condiments Homemade fermented foods and drinks, such as pickles, sauerkraut, or kombucha drinks. (Store-bought pasteurized versions of these are okay.) Other Salads that are made in the store, such as ham salad, chicken salad, egg salad, tuna salad, and seafood salad. The items listed above may not be a complete list of foods and beverages to avoid. Contact your dietitian for more information. This information is not intended to replace advice given to you by your health care provider. Make sure you discuss any questions you have with your health care provider. Document Released: 10/21/2013 Document Revised: 06/14/2015 Document Reviewed: 06/21/2013 [employer] Interactive Patient Education  2018 [employer]  

## 2017-04-23 NOTE — Progress Notes (Signed)
New Obstetric Patient H&P    Chief Complaint: "Desires prenatal care"   History of Present Illness: Patient is a 27 y.o. G1P0 Not Hispanic or Latino female, presents with amenorrhea and positive home pregnancy test. Patient's last menstrual period was 03/02/2017. and based on her  LMP, her EDD is Estimated Date of Delivery: 12/07/2017. and her EGA is [redacted]w[redacted]d. Cycles are 6. days, regular, and occur approximately every : 28 days. Her last pap smear was 1 years ago and was no abnormalities.    She had a urine pregnancy test which was positive 1 week(s)  ago. She was seen in the ER 5 days ago. Ultrasound dating at that time was [redacted]w[redacted]d based on MSD. Her last menstrual period was normal and lasted for  5 or 6 day(s). Since her LMP she claims she has experienced breast tenderness, fatigue. She denies vaginal bleeding. Her past medical history is noncontributory. This is her first pregnancy.  Since her LMP, she admits to the use of tobacco products  no She claims she has gained   10 pounds since the start of her pregnancy.  There are cats in the home in the home  no  She admits close contact with children on a regular basis  yes  She has had chicken pox in the past yes She has had Tuberculosis exposures, symptoms, or previously tested positive for TB   no Current or past history of domestic violence. no  Genetic Screening/Teratology Counseling: (Includes patient, baby's father, or anyone in either family with:)   59. Patient's age >/= 44 [facility]  no 2. Thalassemia ([place], [place], [place], or Asian background): MCV<80  no 3. Neural tube defect (meningomyelocele, spina bifida, anencephaly)  no 4. Congenital heart defect  no  5. Down syndrome  no 6. Tay-Sachs (Jewish, [place])  no 7. Canavan's Disease  no 8. Sickle cell disease or trait (African)  no  9. Hemophilia or other blood disorders  no  10. Muscular dystrophy  no  11. Cystic fibrosis  no  12. Huntington's Chorea  no  13.  Mental retardation/autism  no 14. Other inherited genetic or chromosomal disorder  no 15. Maternal metabolic disorder (DM, PKU, etc)  no 16. Patient or FOB with a child with a birth defect not listed above no  16a. Patient or FOB with a birth defect themselves no 17. Recurrent pregnancy loss, or stillbirth  no  18. Any medications since LMP other than prenatal vitamins (include vitamins, supplements, OTC meds, drugs, alcohol)  no 19. Any other genetic/environmental exposure to discuss  no  Infection History:   1. Lives with someone with TB or TB exposed  no  2. Patient or partner has history of genital herpes  no 3. Rash or viral illness since LMP  no 4. History of STI (GC, CT, HPV, syphilis, HIV)  Yes chlamydia 5. History of recent travel :  no  Other pertinent information:  no     Review of Systems:10 point review of systems negative unless otherwise noted in HPI  Past Medical History:  History reviewed. No pertinent past medical history.  Past Surgical History:  Past Surgical History:  Procedure Laterality Date  . NO PAST SURGERIES      Gynecologic History: Patient's last menstrual period was 03/02/2017.  Obstetric History: G1P0  Family History:  History reviewed. No pertinent family history.  Social History:  Social History   Socioeconomic History  . Marital status: Single    Spouse name: Not on  file  . Number of children: Not on file  . Years of education: Not on file  . Highest education level: Not on file  Occupational History  . Not on file  Social Needs  . Financial resource strain: Not on file  . Food insecurity:    Worry: Not on file    Inability: Not on file  . Transportation needs:    Medical: Not on file    Non-medical: Not on file  Tobacco Use  . Smoking status: Former Research scientist (life sciences)  . Smokeless tobacco: Never Used  Substance and Sexual Activity  . Alcohol use: Yes    Comment: occasionally  . Drug use: No  . Sexual activity: Yes  Lifestyle  .  Physical activity:    Days per week: Not on file    Minutes per session: Not on file  . Stress: Not on file  Relationships  . Social connections:    Talks on phone: Not on file    Gets together: Not on file    Attends religious service: Not on file    Active member of club or organization: Not on file    Attends meetings of clubs or organizations: Not on file    Relationship status: Not on file  . Intimate partner violence:    Fear of current or ex partner: Not on file    Emotionally abused: Not on file    Physically abused: Not on file    Forced sexual activity: Not on file  Other Topics Concern  . Not on file  Social History Narrative  . Not on file    Allergies:  No Known Allergies  Medications: Prior to Admission medications   Medication Sig Start Date End Date Taking? Authorizing Provider  acetaminophen (TYLENOL) 500 MG tablet Take 2 tablets (1,000 mg total) by mouth every 6 (six) hours as needed for mild pain, moderate pain or headache. 05/30/15  Yes Betancourt, Tina A, NP  fluticasone (FLONASE) 50 MCG/ACT nasal spray Place 1 spray into both nostrils 2 (two) times daily. 05/30/15  Yes Betancourt, Aura Fey, NP    Physical Exam Vitals: Blood pressure (!) 96/58, pulse 73, weight 138 lb (62.6 kg), last menstrual period 03/02/2017.  General: NAD HEENT: normocephalic, anicteric Thyroid: no enlargement, no palpable nodules Pulmonary: No increased work of breathing, CTAB Cardiovascular: RRR, distal pulses 2+ Abdomen: NABS, soft, non-tender, non-distended.  Umbilicus without lesions.  No hepatomegaly, splenomegaly or masses palpable. No evidence of hernia  Genitourinary:  External: Normal external female genitalia.  Normal urethral meatus, normal  Bartholin's and Skene's glands.    Vagina: Normal vaginal mucosa, no evidence of prolapse.    Cervix: Grossly normal in appearance, no bleeding, no CMT  Uterus: Enlarged, mobile, normal contour.    Adnexa: ovaries non-enlarged, no  adnexal masses  Rectal: deferred Extremities: no edema, erythema, or tenderness Neurologic: Grossly intact Psychiatric: mood appropriate, affect full   Assessment: 27 y.o. G1P0 at Unknown presenting to initiate prenatal care  Plan: 1) Avoid alcoholic beverages. 2) Patient encouraged not to smoke.  3) Discontinue the use of all non-medicinal drugs and chemicals.  4) Take prenatal vitamins daily.  5) Nutrition, food safety (fish, cheese advisories, and high nitrite foods) and exercise discussed. 6) Hospital and practice style discussed with cross coverage system.  7) Genetic Screening, such as with 1st Trimester Screening, cell free fetal DNA, AFP testing, and Ultrasound, as well as with amniocentesis and CVS as appropriate, is discussed with patient. At the conclusion of today's  visit patient requested genetic testing 8) Patient is asked about travel to areas at risk for the Zika virus, and counseled to avoid travel and exposure to mosquitoes or sexual partners who may have themselves been exposed to the virus. Testing is discussed, and will be ordered as appropriate. 9) Follow up dating/viability scan in 1 week    Rod Can, [place], [facility] 04/23/2017, 1:15 PM

## 2017-04-23 NOTE — Progress Notes (Signed)
NOB Confirmed at [facility] ER

## 2017-04-24 ENCOUNTER — Telehealth: Payer: Self-pay | Admitting: Advanced Practice Midwife

## 2017-04-24 NOTE — Telephone Encounter (Signed)
Called to speak with patient about up coming that we need to schedule for OB u/s and follow up . Unable to leave voicemail due to voicemail not set up

## 2017-04-25 LAB — IGP,CTNGTV,RFX APTIMA HPV ASCU
Chlamydia, Nuc. Acid Amp: NEGATIVE
GONOCOCCUS, NUC. ACID AMP: NEGATIVE
PAP Smear Comment: 0
TRICH VAG BY NAA: NEGATIVE

## 2017-04-25 LAB — URINE CULTURE

## 2017-04-27 LAB — RPR+RH+ABO+RUB AB+AB SCR+CB...
Antibody Screen: NEGATIVE
HEMATOCRIT: 39.3 % (ref 34.0–46.6)
HEP B S AG: NEGATIVE
HIV SCREEN 4TH GENERATION: NONREACTIVE
Hemoglobin: 12.9 g/dL (ref 11.1–15.9)
MCH: 29.4 pg (ref 26.6–33.0)
MCHC: 32.8 g/dL (ref 31.5–35.7)
MCV: 90 fL (ref 79–97)
PLATELETS: 399 10*3/uL — AB (ref 150–379)
RBC: 4.39 x10E6/uL (ref 3.77–5.28)
RDW: 13.2 % (ref 12.3–15.4)
RH TYPE: POSITIVE
RPR: NONREACTIVE
Rubella Antibodies, IGG: 3.89 index (ref >0.99)
Varicella zoster IgG: 202 index (ref >165)
WBC: 6.5 10*3/uL (ref 3.4–10.8)

## 2017-04-27 LAB — HEMOGLOBINOPATHY EVALUATION
HEMOGLOBIN A2 QUANTITATION: 2.4 % (ref 1.8–3.2)
HEMOGLOBIN F QUANTITATION: 0 % (ref 0.0–2.0)
HGB A: 97.6 % (ref 96.4–98.8)
HGB C: 0 %
HGB S: 0 %
HGB VARIANT: 0 %

## 2017-05-04 ENCOUNTER — Encounter: Payer: Self-pay | Admitting: Maternal Newborn

## 2017-05-04 ENCOUNTER — Ambulatory Visit (INDEPENDENT_AMBULATORY_CARE_PROVIDER_SITE_OTHER): Payer: BLUE CROSS/BLUE SHIELD

## 2017-05-04 ENCOUNTER — Ambulatory Visit (INDEPENDENT_AMBULATORY_CARE_PROVIDER_SITE_OTHER): Payer: BLUE CROSS/BLUE SHIELD | Admitting: Maternal Newborn

## 2017-05-04 VITALS — BP 110/70 | Wt 136.0 lb

## 2017-05-04 DIAGNOSIS — Z3400 Encounter for supervision of normal first pregnancy, unspecified trimester: Secondary | ICD-10-CM

## 2017-05-04 NOTE — Progress Notes (Signed)
    Routine Prenatal Care Visit  Subjective  Sharon Soto is a 27 y.o. G1P0 being seen today for ongoing prenatal care.  She is currently monitored for the following issues for this low-risk pregnancy and has Condyloma acuminata and Supervision of normal first pregnancy, antepartum on their problem list.  ----------------------------------------------------------------------------------- Patient reports low appetite and vaginal dryness.   Vag. Bleeding: None. ----------------------------------------------------------------------------------- The following portions of the patient's history were reviewed and updated as appropriate: allergies, current medications, past family history, past medical history, past social history, past surgical history and problem list. Problem list updated.   Objective  Blood pressure 110/70, weight 136 lb (61.7 kg), last menstrual period 03/02/2017. Pregravid weight 128 lb (58.1 kg) Total Weight Gain 8 lb (3.629 kg) Urinalysis:      Fetal Status: Fetal Heart Rate (bpm): 149         General:  Alert, oriented and cooperative. Patient is in no acute distress.  Skin: Skin is warm and dry. No rash noted.   Cardiovascular: Normal heart rate noted  Respiratory: Normal respiratory effort, no problems with respiration noted  Abdomen: Soft, gravid, appropriate for gestational age.       Pelvic:  Cervical exam deferred        Extremities: Normal range of motion.     Mental Status: Normal mood and affect. Normal behavior. Normal judgment and thought content.     Assessment   27 y.o. G1P0 at [redacted]w[redacted]d by Ultrasound, EDD 12/20/2017 presenting for routine prenatal visit.  Plan   Pregnancy#1 Problems (from 03/02/17 to present)    Problem Noted Resolved   Supervision of normal first pregnancy, antepartum 04/23/2017 by Rod Can, CNM No   Overview Addendum 05/04/2017  9:11 AM by Rexene Agent, [place] Prenatal Labs  Dating  Blood type:  A/Positive/-- (04/04 0940)   Genetic Screen 1 Screen:    AFP:     Quad:     NIPS: Antibody:Negative (04/04 0940)  Anatomic [place]  Rubella: 3.89 (04/04 0940) Varicella: Immune  GTT Early:               Third trimester:  RPR: Non Reactive (04/04 0940)   Rhogam  HBsAg: Negative (04/04 0940)   TDaP vaccine                       Flu Shot: HIV: Non Reactive (04/04 0940)   Baby Food                                GBS:   Contraception  Pap: 04/23/2017, NILM  CBB     CS/VBAC NA   Support Person Alvester Chou            Ultrasound today shows single IUP at 7w 1d with cardiac activity. EDD is now 12/20/2017 based on ACOG Dating Criteria.  Gestational age appropriate obstetric precautions were reviewed.  Please refer to After Visit Summary for other counseling recommendations.   Genetic screening next visit or 12 weeks if first trimester screen.  Return in about 1 month (around 06/01/2017) for [place].  Avel Sensor, CNM 05/04/2017  10:32 AM

## 2017-05-04 NOTE — Patient Instructions (Signed)
First Trimester of Pregnancy The first trimester of pregnancy is from week 1 until the end of week 13 (months 1 through 3). A week after a sperm fertilizes an egg, the egg will implant on the wall of the uterus. This embryo will begin to develop into a baby. Genes from you and your partner will form the baby. The female genes will determine whether the baby will be a boy or a girl. At 6-8 weeks, the eyes and face will be formed, and the heartbeat can be seen on ultrasound. At the end of 12 weeks, all the baby's organs will be formed. Now that you are pregnant, you will want to do everything you can to have a healthy baby. Two of the most important things are to get good prenatal care and to follow your health care provider's instructions. Prenatal care is all the medical care you receive before the baby's birth. This care will help prevent, find, and treat any problems during the pregnancy and childbirth. Body changes during your first trimester Your body goes through many changes during pregnancy. The changes vary from woman to woman.  You may gain or lose a couple of pounds at first.  You may feel sick to your stomach (nauseous) and you may throw up (vomit). If the vomiting is uncontrollable, call your health care provider.  You may tire easily.  You may develop headaches that can be relieved by medicines. All medicines should be approved by your health care provider.  You may urinate more often. Painful urination may mean you have a bladder infection.  You may develop heartburn as a result of your pregnancy.  You may develop constipation because certain hormones are causing the muscles that push stool through your intestines to slow down.  You may develop hemorrhoids or swollen veins (varicose veins).  Your breasts may begin to grow larger and become tender. Your nipples may stick out more, and the tissue that surrounds them (areola) may become darker.  Your gums may bleed and may be  sensitive to brushing and flossing.  Dark spots or blotches (chloasma, mask of pregnancy) may develop on your face. This will likely fade after the baby is born.  Your menstrual periods will stop.  You may have a loss of appetite.  You may develop cravings for certain kinds of food.  You may have changes in your emotions from day to day, such as being excited to be pregnant or being concerned that something may go wrong with the pregnancy and baby.  You may have more vivid and strange dreams.  You may have changes in your hair. These can include thickening of your hair, rapid growth, and changes in texture. Some women also have hair loss during or after pregnancy, or hair that feels dry or thin. Your hair will most likely return to normal after your baby is born.  What to expect at prenatal visits During a routine prenatal visit:  You will be weighed to make sure you and the baby are growing normally.  Your blood pressure will be taken.  Your abdomen will be measured to track your baby's growth.  The fetal heartbeat will be listened to between weeks 10 and 14 of your pregnancy.  Test results from any previous visits will be discussed.  Your health care provider may ask you:  How you are feeling.  If you are feeling the baby move.  If you have had any abnormal symptoms, such as leaking fluid, bleeding, severe headaches,   or abdominal cramping.  If you are using any tobacco products, including cigarettes, chewing tobacco, and electronic cigarettes.  If you have any questions.  Other tests that may be performed during your first trimester include:  Blood tests to find your blood type and to check for the presence of any previous infections. The tests will also be used to check for low iron levels (anemia) and protein on red blood cells (Rh antibodies). Depending on your risk factors, or if you previously had diabetes during pregnancy, you may have tests to check for high blood  sugar that affects pregnant women (gestational diabetes).  Urine tests to check for infections, diabetes, or protein in the urine.  An ultrasound to confirm the proper growth and development of the baby.  Fetal screens for spinal cord problems (spina bifida) and Down syndrome.  HIV (human immunodeficiency virus) testing. Routine prenatal testing includes screening for HIV, unless you choose not to have this test.  You may need other tests to make sure you and the baby are doing well.  Follow these instructions at home: Medicines  Follow your health care provider's instructions regarding medicine use. Specific medicines may be either safe or unsafe to take during pregnancy.  Take a prenatal vitamin that contains at least 600 micrograms (mcg) of folic acid.  If you develop constipation, try taking a stool softener if your health care provider approves. Eating and drinking  Eat a balanced diet that includes fresh fruits and vegetables, whole grains, good sources of protein such as meat, eggs, or tofu, and low-fat dairy. Your health care provider will help you determine the amount of weight gain that is right for you.  Avoid raw meat and uncooked cheese. These carry germs that can cause birth defects in the baby.  Eating four or five small meals rather than three large meals a day may help relieve nausea and vomiting. If you start to feel nauseous, eating a few soda crackers can be helpful. Drinking liquids between meals, instead of during meals, also seems to help ease nausea and vomiting.  Limit foods that are high in fat and processed sugars, such as fried and sweet foods.  To prevent constipation: ? Eat foods that are high in fiber, such as fresh fruits and vegetables, whole grains, and beans. ? Drink enough fluid to keep your urine clear or pale yellow. Activity  Exercise only as directed by your health care provider. Most women can continue their usual exercise routine during  pregnancy. Try to exercise for 30 minutes at least 5 days a week. Exercising will help you: ? Control your weight. ? Stay in shape. ? Be prepared for labor and delivery.  Experiencing pain or cramping in the lower abdomen or lower back is a good sign that you should stop exercising. Check with your health care provider before continuing with normal exercises.  Try to avoid standing for long periods of time. Move your legs often if you must stand in one place for a long time.  Avoid heavy lifting.  Wear low-heeled shoes and practice good posture.  You may continue to have sex unless your health care provider tells you not to. Relieving pain and discomfort  Wear a good support bra to relieve breast tenderness.  Take warm sitz baths to soothe any pain or discomfort caused by hemorrhoids. Use hemorrhoid cream if your health care provider approves.  Rest with your legs elevated if you have leg cramps or low back pain.  If you develop   varicose veins in your legs, wear support hose. Elevate your feet for 15 minutes, 3-4 times a day. Limit salt in your diet. Prenatal care  Schedule your prenatal visits by the twelfth week of pregnancy. They are usually scheduled monthly at first, then more often in the last 2 months before delivery.  Write down your questions. Take them to your prenatal visits.  Keep all your prenatal visits as told by your health care provider. This is important. Safety  Wear your seat belt at all times when driving.  Make a list of emergency phone numbers, including numbers for family, friends, the hospital, and police and fire departments. General instructions  Ask your health care provider for a referral to a local prenatal education class. Begin classes no later than the beginning of month 6 of your pregnancy.  Ask for help if you have counseling or nutritional needs during pregnancy. Your health care provider can offer advice or refer you to specialists for help  with various needs.  Do not use hot tubs, steam rooms, or saunas.  Do not douche or use tampons or scented sanitary pads.  Do not cross your legs for long periods of time.  Avoid cat litter boxes and soil used by cats. These carry germs that can cause birth defects in the baby and possibly loss of the fetus by miscarriage or stillbirth.  Avoid all smoking, herbs, alcohol, and medicines not prescribed by your health care provider. Chemicals in these products affect the formation and growth of the baby.  Do not use any products that contain nicotine or tobacco, such as cigarettes and e-cigarettes. If you need help quitting, ask your health care provider. You may receive counseling support and other resources to help you quit.  Schedule a dentist appointment. At home, brush your teeth with a soft toothbrush and be gentle when you floss. Contact a health care provider if:  You have dizziness.  You have mild pelvic cramps, pelvic pressure, or nagging pain in the abdominal area.  You have persistent nausea, vomiting, or diarrhea.  You have a bad smelling vaginal discharge.  You have pain when you urinate.  You notice increased swelling in your face, hands, legs, or ankles.  You are exposed to fifth disease or chickenpox.  You are exposed to German measles (rubella) and have never had it. Get help right away if:  You have a fever.  You are leaking fluid from your vagina.  You have spotting or bleeding from your vagina.  You have severe abdominal cramping or pain.  You have rapid weight gain or loss.  You vomit blood or material that looks like coffee grounds.  You develop a severe headache.  You have shortness of breath.  You have any kind of trauma, such as from a fall or a car accident. Summary  The first trimester of pregnancy is from week 1 until the end of week 13 (months 1 through 3).  Your body goes through many changes during pregnancy. The changes vary from  woman to woman.  You will have routine prenatal visits. During those visits, your health care provider will examine you, discuss any test results you may have, and talk with you about how you are feeling. This information is not intended to replace advice given to you by your health care provider. Make sure you discuss any questions you have with your health care provider. Document Released: 12/31/2000 Document Revised: 12/19/2015 Document Reviewed: 12/19/2015 Elsevier Interactive Patient Education  2018 Elsevier   Inc.  

## 2017-05-04 NOTE — Progress Notes (Signed)
C/o vag dryness, mood swings.

## 2017-05-12 IMAGING — CR DG CHEST 2V
2 series · 2 of 2 positions shown · non-contrast
Comparison: None.

CLINICAL DATA: Right upper abdominal pain radiating to the
shoulder, onset yesterday. The pain is pleuritic.

EXAM:
CHEST  2 VIEW

[chest pa]
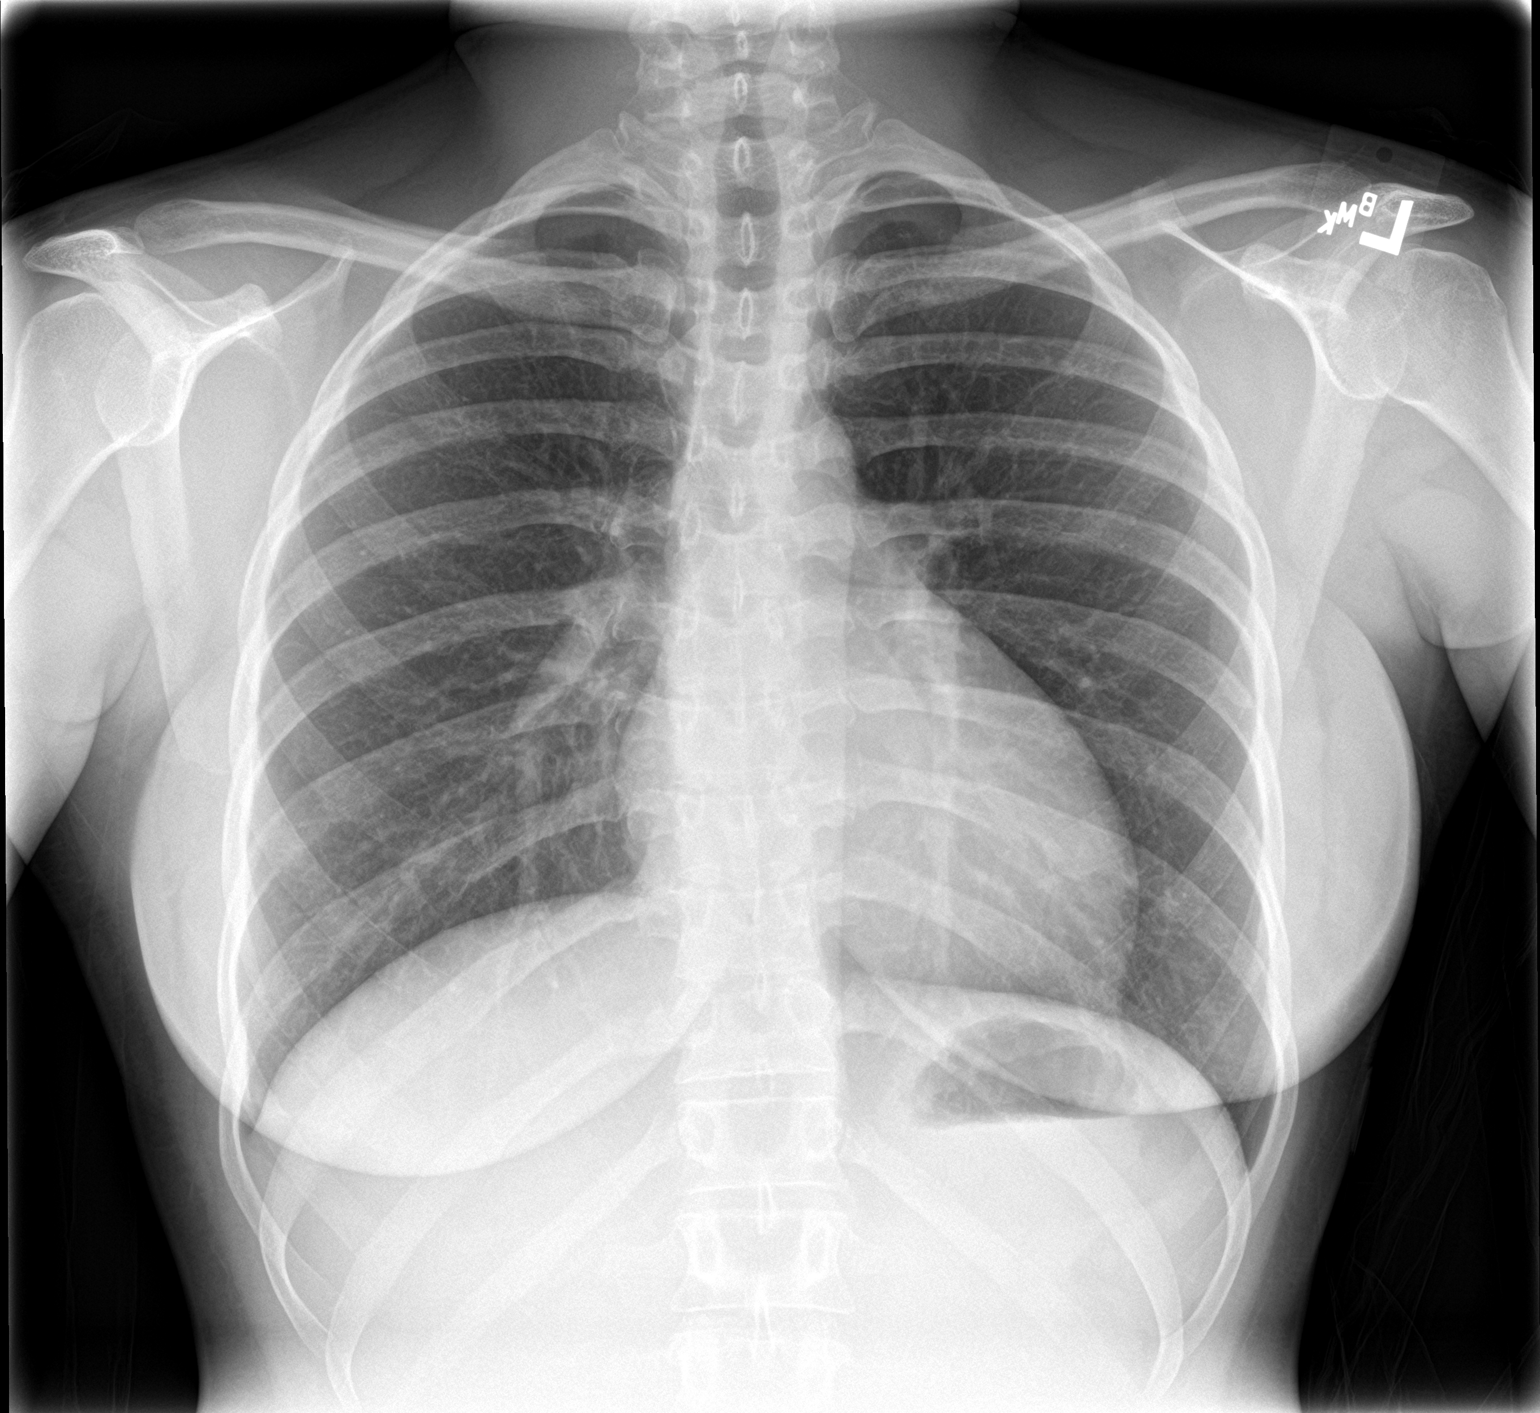

[chest lat]
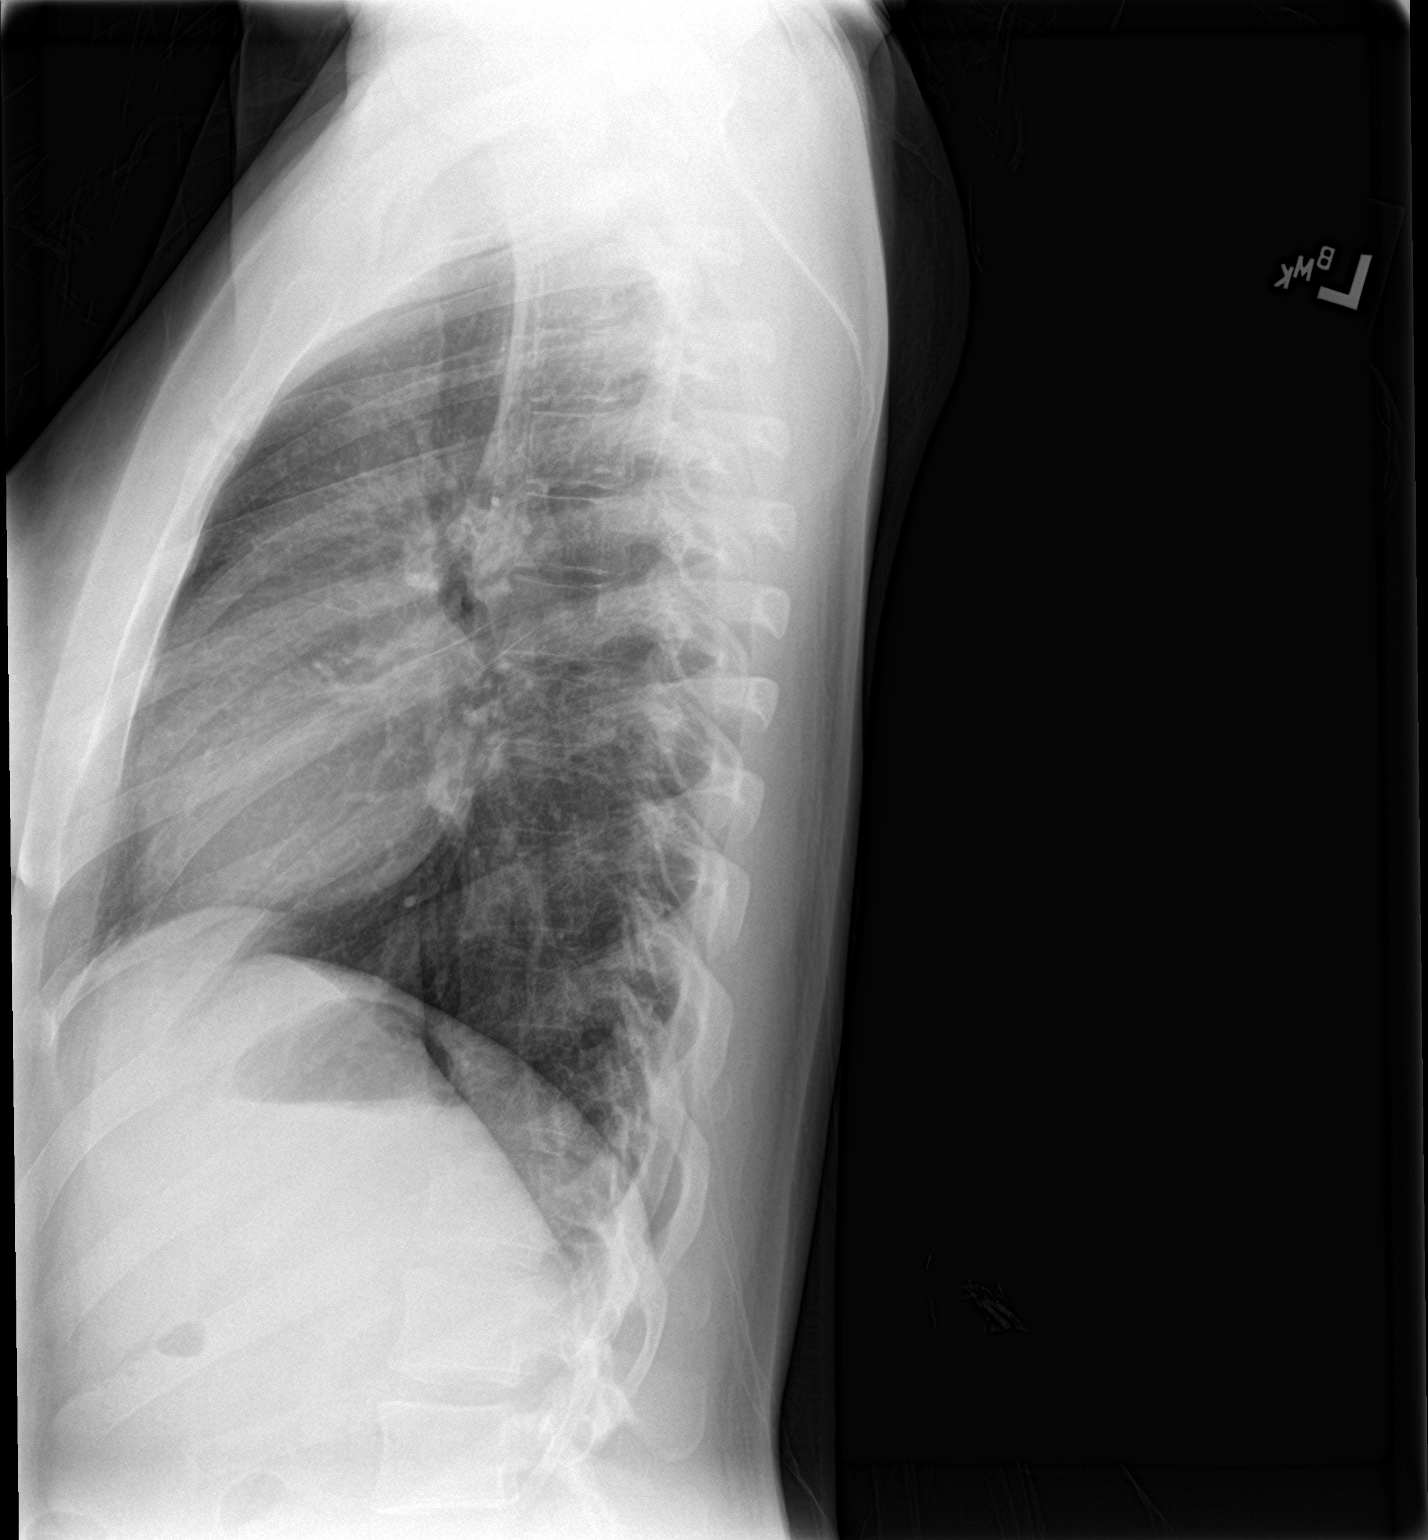

[2 of 2 positions shown; findings below may reference images not displayed]

FINDINGS: The heart size and mediastinal contours are within normal limits.
Both lungs are clear. The visualized skeletal structures are
unremarkable.
IMPRESSION: No active cardiopulmonary disease.

## 2017-06-04 ENCOUNTER — Ambulatory Visit (INDEPENDENT_AMBULATORY_CARE_PROVIDER_SITE_OTHER): Payer: BLUE CROSS/BLUE SHIELD | Admitting: Obstetrics & Gynecology

## 2017-06-04 VITALS — BP 120/80 | Wt 131.0 lb

## 2017-06-04 DIAGNOSIS — Z3400 Encounter for supervision of normal first pregnancy, unspecified trimester: Secondary | ICD-10-CM

## 2017-06-04 DIAGNOSIS — Z3A11 11 weeks gestation of pregnancy: Secondary | ICD-10-CM

## 2017-06-04 DIAGNOSIS — N898 Other specified noninflammatory disorders of vagina: Secondary | ICD-10-CM

## 2017-06-04 NOTE — Patient Instructions (Signed)

## 2017-06-04 NOTE — Progress Notes (Signed)
  Subjective  Fetal Movement? no Contractions? no Leaking Fluid? no Vaginal Bleeding? no Ocas light vag discharge without odor, irritation Objective  BP 120/80   Wt 131 lb (59.4 kg)   LMP 03/02/2017   BMI 25.58 kg/m  General: NAD Pumonary: no increased work of breathing Abdomen: gravid, non-tender Extremities: no edema Psychiatric: mood appropriate, affect full SVE cervix closed, scant d/c Assessment  27 y.o. G1P0 at [redacted]w[redacted]d by  12/20/2017, by Ultrasound presenting for routine prenatal visit  Plan   Problem List Items Addressed This Visit      Other   Supervision of normal first pregnancy, antepartum    Other Visit Diagnoses    [redacted] weeks gestation of pregnancy    -  Primary   Vaginal discharge       Relevant Orders   NuSwab Vaginitis Plus (VG+)    Test for BV, yeast, other Declines genetic testing  Barnett Applebaum, MD, [place], [facility] 06/04/2017  1:38 PM

## 2017-06-06 LAB — NUSWAB VAGINITIS PLUS (VG+)
Candida albicans, NAA: POSITIVE — AB
Candida glabrata, NAA: NEGATIVE
Chlamydia trachomatis, NAA: NEGATIVE
NEISSERIA GONORRHOEAE, NAA: NEGATIVE
Trich vag by NAA: NEGATIVE

## 2017-06-08 ENCOUNTER — Other Ambulatory Visit: Payer: Self-pay | Admitting: Obstetrics & Gynecology

## 2017-06-08 MED ORDER — TERCONAZOLE 0.4 % VA CREA: 1.0000 | TOPICAL_CREAM | Freq: Every day | VAGINAL | 0 refills | Status: DC

## 2017-06-08 NOTE — Progress Notes (Signed)
Recent culture Pos for candida albicans Pregnancy 11 weeks D/w pt eRx Terazol 7 Discussed sx's to watch for related to side effects and recurrences  Barnett Applebaum, MD, [facility], [facility] 06/08/2017  7:53 AM

## 2017-07-02 ENCOUNTER — Ambulatory Visit (INDEPENDENT_AMBULATORY_CARE_PROVIDER_SITE_OTHER): Payer: BLUE CROSS/BLUE SHIELD | Admitting: Obstetrics and Gynecology

## 2017-07-02 ENCOUNTER — Encounter: Payer: Self-pay | Admitting: Obstetrics and Gynecology

## 2017-07-02 VITALS — BP 100/66 | Wt 130.0 lb

## 2017-07-02 DIAGNOSIS — Z3A15 15 weeks gestation of pregnancy: Secondary | ICD-10-CM

## 2017-07-02 DIAGNOSIS — Z3400 Encounter for supervision of normal first pregnancy, unspecified trimester: Secondary | ICD-10-CM

## 2017-07-02 NOTE — Addendum Note (Signed)
Addended by: Prentice Docker D on: 07/02/2017 01:59 PM   Modules accepted: Orders

## 2017-07-02 NOTE — Progress Notes (Signed)
  Routine Prenatal Care Visit  Subjective  Sharon Soto is a 27 y.o. G1P0 at [redacted]w[redacted]d being seen today for ongoing prenatal care.  She is currently monitored for the following issues for this low-risk pregnancy and has Condyloma acuminata and Supervision of normal first pregnancy, antepartum on their problem list.  ----------------------------------------------------------------------------------- Patient reports no complaints.   Contractions: Not present. Vag. Bleeding: None.  Movement: Absent. Denies leaking of fluid.  ----------------------------------------------------------------------------------- The following portions of the patient's history were reviewed and updated as appropriate: allergies, current medications, past family history, past medical history, past social history, past surgical history and problem list. Problem list updated.   Objective  Blood pressure 100/66, weight 130 lb (59 kg), last menstrual period 03/02/2017. Pregravid weight 128 lb (58.1 kg) Total Weight Gain 2 lb (0.907 kg) Urinalysis:      Fetal Status: Fetal Heart Rate (bpm): present   Movement: Absent     General:  Alert, oriented and cooperative. Patient is in no acute distress.  Skin: Skin is warm and dry. No rash noted.   Cardiovascular: Normal heart rate noted  Respiratory: Normal respiratory effort, no problems with respiration noted  Abdomen: Soft, gravid, appropriate for gestational age. Pain/Pressure: Absent     Pelvic:  Cervical exam deferred        Extremities: Normal range of motion.     Mental Status: Normal mood and affect. Normal behavior. Normal judgment and thought content.   Assessment   27 y.o. G1P0 at [redacted]w[redacted]d by  12/20/2017, by Ultrasound presenting for routine prenatal visit  Plan   Pregnancy#1 Problems (from 03/02/17 to present)    Problem Noted Resolved   Supervision of normal first pregnancy, antepartum 04/23/2017 by Rod Can, CNM No   Overview Addendum 05/04/2017  9:11 AM  by Rexene Agent, [place] Prenatal Labs  Dating  Blood type: A/Positive/-- (04/04 0940)   Genetic Screen 1 Screen:    AFP:     Quad:     NIPS: Antibody:Negative (04/04 0940)  Anatomic [place]  Rubella: 3.89 (04/04 0940) Varicella: Immune  GTT Early:               Third trimester:  RPR: Non Reactive (04/04 0940)   Rhogam  HBsAg: Negative (04/04 0940)   TDaP vaccine                       Flu Shot: HIV: Non Reactive (04/04 0940)   Baby Food                                GBS:   Contraception  Pap: 04/23/2017, NILM  CBB     CS/VBAC NA   Support Person Alvester Chou             Preterm labor symptoms and general obstetric precautions including but not limited to vaginal bleeding, contractions, leaking of fluid and fetal movement were reviewed in detail with the patient. Please refer to After Visit Summary for other counseling recommendations.   Return in about 1 month (around 07/30/2017) for schedule u/s for anatomy and routine prenatal after.  Prentice Docker, MD, [facility], [facility] 07/02/2017 1:56 PM

## 2017-07-02 NOTE — Progress Notes (Incomplete)
  Routine Prenatal Care Visit  Subjective  Sharon Soto is a 27 y.o. G1P0 at [redacted]w[redacted]d being seen today for ongoing prenatal care.  She is currently monitored for the following issues for this {Blank single:19197::"high-risk","low-risk"} pregnancy and has Condyloma acuminata and Supervision of normal first pregnancy, antepartum on their problem list.  ----------------------------------------------------------------------------------- Patient reports {sx:14538}.   Contractions: Not present. Vag. Bleeding: None.  Movement: Absent. Denies leaking of fluid.  ----------------------------------------------------------------------------------- The following portions of the patient's history were reviewed and updated as appropriate: allergies, current medications, past family history, past medical history, past social history, past surgical history and problem list. Problem list updated.   Objective  Blood pressure 100/66, weight 130 lb (59 kg), last menstrual period 03/02/2017. Pregravid weight 128 lb (58.1 kg) Total Weight Gain 2 lb (0.907 kg) Urinalysis:      Fetal Status: Fetal Heart Rate (bpm): present   Movement: Absent     General:  Alert, oriented and cooperative. Patient is in no acute distress.  Skin: Skin is warm and dry. No rash noted.   Cardiovascular: Normal heart rate noted  Respiratory: Normal respiratory effort, no problems with respiration noted  Abdomen: Soft, gravid, appropriate for gestational age. Pain/Pressure: Absent     Pelvic:  {Blank single:19197::"Cervical exam performed","Cervical exam deferred"}        Extremities: Normal range of motion.     Mental Status: Normal mood and affect. Normal behavior. Normal judgment and thought content.   Assessment   27 y.o. G1P0 at [redacted]w[redacted]d by  12/20/2017, by Ultrasound presenting for {Blank single:19197::"routine","work-in"} prenatal visit  Plan   Pregnancy#1 Problems (from 03/02/17 to present)    Problem Noted Resolved   Supervision of normal first pregnancy, antepartum 04/23/2017 by Rod Can, CNM No   Overview Addendum 05/04/2017  9:11 AM by Rexene Agent, [place] Prenatal Labs  Dating  Blood type: A/Positive/-- (04/04 0940)   Genetic Screen 1 Screen:    AFP:     Quad:     NIPS: Antibody:Negative (04/04 0940)  Anatomic [place]  Rubella: 3.89 (04/04 0940) Varicella: Immune  GTT Early:               Third trimester:  RPR: Non Reactive (04/04 0940)   Rhogam  HBsAg: Negative (04/04 0940)   TDaP vaccine                       Flu Shot: HIV: Non Reactive (04/04 0940)   Baby Food                                GBS:   Contraception  Pap: 04/23/2017, NILM  CBB     CS/VBAC NA   Support Person Alvester Chou               {Blank single:19197::"Term","Preterm"} labor symptoms and general obstetric precautions including but not limited to vaginal bleeding, contractions, leaking of fluid and fetal movement were reviewed in detail with the patient. Please refer to After Visit Summary for other counseling recommendations.   Return in about 1 month (around 07/30/2017) for schedule u/s for anatomy and routine prenatal after.  Prentice Docker, MD, [facility], [facility] 07/02/2017 1:56 PM

## 2017-07-31 ENCOUNTER — Ambulatory Visit (INDEPENDENT_AMBULATORY_CARE_PROVIDER_SITE_OTHER): Payer: BLUE CROSS/BLUE SHIELD

## 2017-07-31 ENCOUNTER — Encounter: Payer: Self-pay | Admitting: Obstetrics and Gynecology

## 2017-07-31 ENCOUNTER — Ambulatory Visit (INDEPENDENT_AMBULATORY_CARE_PROVIDER_SITE_OTHER): Payer: BLUE CROSS/BLUE SHIELD | Admitting: Obstetrics and Gynecology

## 2017-07-31 VITALS — BP 106/68 | Wt 130.0 lb

## 2017-07-31 DIAGNOSIS — Z363 Encounter for antenatal screening for malformations: Secondary | ICD-10-CM | POA: Diagnosis not present

## 2017-07-31 DIAGNOSIS — A630 Anogenital (venereal) warts: Secondary | ICD-10-CM

## 2017-07-31 DIAGNOSIS — Z3400 Encounter for supervision of normal first pregnancy, unspecified trimester: Secondary | ICD-10-CM

## 2017-07-31 DIAGNOSIS — O98512 Other viral diseases complicating pregnancy, second trimester: Secondary | ICD-10-CM

## 2017-07-31 DIAGNOSIS — Z3A19 19 weeks gestation of pregnancy: Secondary | ICD-10-CM

## 2017-07-31 NOTE — Progress Notes (Incomplete)
  Routine Prenatal Care Visit  Subjective  Sharon Soto is a 27 y.o. G1P0 at [redacted]w[redacted]d being seen today for ongoing prenatal care.  She is currently monitored for the following issues for this {Blank single:19197::"high-risk","low-risk"} pregnancy and has Condyloma acuminata and Supervision of normal first pregnancy, antepartum on their problem list.  ----------------------------------------------------------------------------------- Patient reports {sx:14538}.   Contractions: Not present. Vag. Bleeding: None.   . Denies leaking of fluid.  ----------------------------------------------------------------------------------- The following portions of the patient's history were reviewed and updated as appropriate: allergies, current medications, past family history, past medical history, past social history, past surgical history and problem list. Problem list updated.   Objective  Blood pressure 106/68, weight 130 lb (59 kg), last menstrual period 03/02/2017. Pregravid weight 128 lb (58.1 kg) Total Weight Gain 2 lb (0.907 kg) Urinalysis: Urine Protein: 1+ Urine Glucose: Negative  Fetal Status: Fetal Heart Rate (bpm): present         General:  Alert, oriented and cooperative. Patient is in no acute distress.  Skin: Skin is warm and dry. No rash noted.   Cardiovascular: Normal heart rate noted  Respiratory: Normal respiratory effort, no problems with respiration noted  Abdomen: Soft, gravid, appropriate for gestational age. Pain/Pressure: Absent     Pelvic:  {Blank single:19197::"Cervical exam performed","Cervical exam deferred"}        Extremities: Normal range of motion.     Mental Status: Normal mood and affect. Normal behavior. Normal judgment and thought content.   Assessment   27 y.o. G1P0 at [redacted]w[redacted]d by  12/20/2017, by Ultrasound presenting for {Blank single:19197::"routine","work-in"} prenatal visit  Plan   Pregnancy#1 Problems (from 03/02/17 to present)    Problem Noted Resolved   Supervision of normal first pregnancy, antepartum 04/23/2017 by Rod Can, CNM No   Overview Addendum 05/04/2017  9:11 AM by Rexene Agent, [place] Prenatal Labs  Dating  Blood type: A/Positive/-- (04/04 0940)   Genetic Screen 1 Screen:    AFP:     Quad:     NIPS: Antibody:Negative (04/04 0940)  Anatomic [place]  Rubella: 3.89 (04/04 0940) Varicella: Immune  GTT Early:               Third trimester:  RPR: Non Reactive (04/04 0940)   Rhogam  HBsAg: Negative (04/04 0940)   TDaP vaccine                       Flu Shot: HIV: Non Reactive (04/04 0940)   Baby Food                                GBS:   Contraception  Pap: 04/23/2017, NILM  CBB     CS/VBAC NA   Support Person Alvester Chou               {Blank single:19197::"Term","Preterm"} labor symptoms and general obstetric precautions including but not limited to vaginal bleeding, contractions, leaking of fluid and fetal movement were reviewed in detail with the patient. Please refer to After Visit Summary for other counseling recommendations.   Return in about 1 month (around 08/28/2017) for schedule completion anatomy and routine prenatal.  Prentice Docker, MD, [place], [facility] 07/31/2017 3:02 PM

## 2017-07-31 NOTE — Progress Notes (Signed)
Routine Prenatal Care Visit  Subjective  Sharon Soto is a 27 y.o. G1P0 at [redacted]w[redacted]d being seen today for ongoing prenatal care.  She is currently monitored for the following issues for this low-risk pregnancy and has Condyloma acuminata and Supervision of normal first pregnancy, antepartum on their problem list.  ----------------------------------------------------------------------------------- Patient reports no complaints.   Contractions: Not present. Vag. Bleeding: None.   . Denies leaking of fluid.  U/S incomplete today.  Fibroid present.  ----------------------------------------------------------------------------------- The following portions of the patient's history were reviewed and updated as appropriate: allergies, current medications, past family history, past medical history, past social history, past surgical history and problem list. Problem list updated.   Objective  Blood pressure 106/68, weight 130 lb (59 kg), last menstrual period 03/02/2017. Pregravid weight 128 lb (58.1 kg) Total Weight Gain 2 lb (0.907 kg) Urinalysis: Urine Protein: 1+ Urine Glucose: Negative  Fetal Status: Fetal Heart Rate (bpm): present         General:  Alert, oriented and cooperative. Patient is in no acute distress.  Skin: Skin is warm and dry. No rash noted.   Cardiovascular: Normal heart rate noted  Respiratory: Normal respiratory effort, no problems with respiration noted  Abdomen: Soft, gravid, appropriate for gestational age. Pain/Pressure: Absent     Pelvic:  Cervical exam deferred        Extremities: Normal range of motion.     Mental Status: Normal mood and affect. Normal behavior. Normal judgment and thought content.   US Ob Comp + 14 Wk  Result Date: 07/31/2017 ULTRASOUND REPORT Patient Name: Sharon Soto DOB: Jun 05, 1990 MRN: [id number] Location: [facility] Date of Service: 07/31/2017 Indications:Anatomy Ultrasound Findings: Nelda [place] intrauterine pregnancy is visualized  with FHR at 155 BPM. Biometrics give an (U/S) Gestational age of [redacted]w[redacted]d and an (U/S) EDD of 12/21/2017; this correlates with the clinically established Estimated Date of Delivery: 12/20/17 Fetal presentation is Breech. EFW: 11oz, 302 grams. Placenta: Posterior, grade 0. AFI: subjectively normal. Anatomic survey is incomplete for nose/lip and otherwise normal; Gender - female.  Right Ovary is normal in appearance. Left Ovary is normal appearance. Survey of the adnexa demonstrates no adnexal masses. There is no free peritoneal fluid in the cul de sac. There is a Right Lateral Uterine Fibroid (Intramural) measuring 6.87 x 5.38cm Impression: 1. [redacted]w[redacted]d Viable Singleton Intrauterine pregnancy by U/S. 2. (U/S) EDD is consistent with Clinically established Estimated Date of Delivery: 12/20/17 . 3. Normal Anatomy Scan Recommendations: 1.Clinical correlation with the patient's History and Physical Exam. 2. Follow up in 4 weeks to complete Anatomic Survey Edwena Bunde, RDMS, RVT There is a singleton gestation with subjectively normal amniotic fluid volume. The fetal biometry correlates with established dating. Detailed evaluation of the fetal anatomy was performed.The fetal anatomical survey appears within normal limits within the resolution of ultrasound as described above.  Not all structures were able to be visualized on today's study.  It is recommended that a follow-up ultrasound be performed to complete visualization of the unobserved structures today.  It must be noted that a normal ultrasound is unable to rule out fetal aneuploidy nor is it able to detect all possible malformations.   The ultrasound images and findings were reviewed by me and I agree with the above report. Prentice Docker, MD, [facility], [facility] 07/31/2017 2:54 PM    Assessment   27 y.o. G1P0 at [redacted]w[redacted]d by  12/20/2017, by Ultrasound presenting for routine prenatal visit  Plan   Pregnancy#1 Problems (from  03/02/17 to  present)    Problem Noted Resolved   Supervision of normal first pregnancy, antepartum 04/23/2017 by Rod Can, CNM No   Overview Addendum 05/04/2017  9:11 AM by Rexene Agent, [place] Prenatal Labs  Dating  Blood type: A/Positive/-- (04/04 0940)   Genetic Screen 1 Screen:    AFP:     Quad:     NIPS: Antibody:Negative (04/04 0940)  Anatomic [place]  Rubella: 3.89 (04/04 0940) Varicella: Immune  GTT Early:               Third trimester:  RPR: Non Reactive (04/04 0940)   Rhogam  HBsAg: Negative (04/04 0940)   TDaP vaccine                       Flu Shot: HIV: Non Reactive (04/04 0940)   Baby Food                                GBS:   Contraception  Pap: 04/23/2017, NILM  CBB     CS/VBAC NA   Support Person Alvester Chou              Preterm labor symptoms and general obstetric precautions including but not limited to vaginal bleeding, contractions, leaking of fluid and fetal movement were reviewed in detail with the patient. Please refer to After Visit Summary for other counseling recommendations.   Return in about 1 month (around 08/28/2017) for schedule completion anatomy and routine prenatal.  Prentice Docker, MD, [place], [facility] 07/31/2017 3:02 PM

## 2017-08-09 IMAGING — US US ABDOMEN LIMITED
1 series · 14 of 25 positions shown · non-contrast
Comparison: None.

CLINICAL DATA: Right upper quadrant pain radiating to the shoulder
for 2 or 3 days.

EXAM:
US ABDOMEN LIMITED - RIGHT UPPER QUADRANT

[Series 1: us abdomen limited · 0.20mm/px · 14 of 34 slices shown]
[im 1/34]
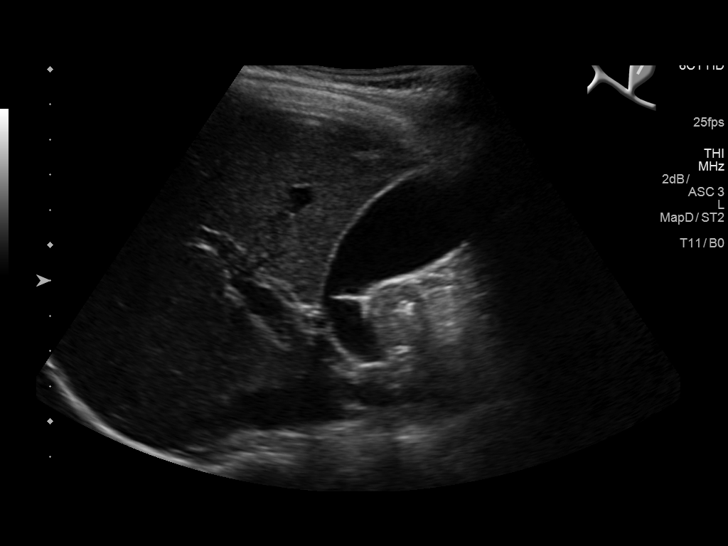
[im 3/34]
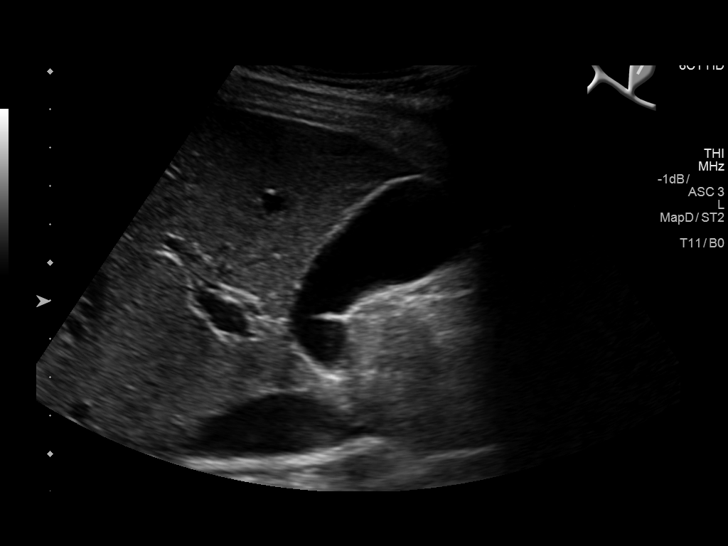
[im 6/34]
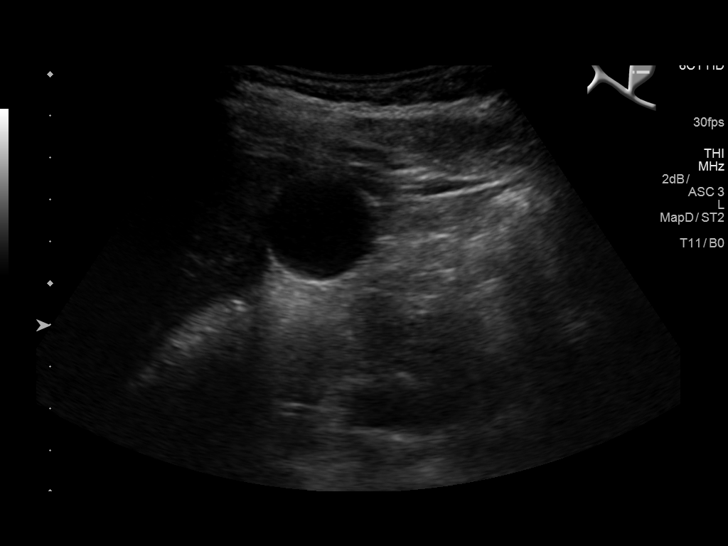
[im 9/34]
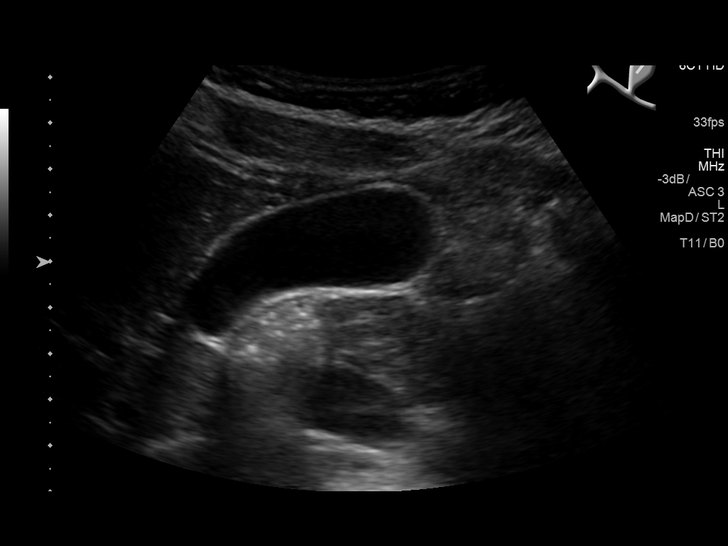
[im 12/34]
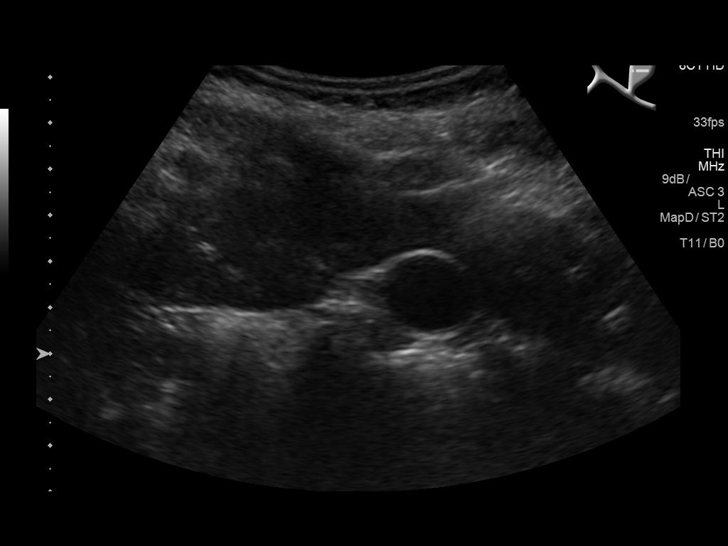
[im 13/34]
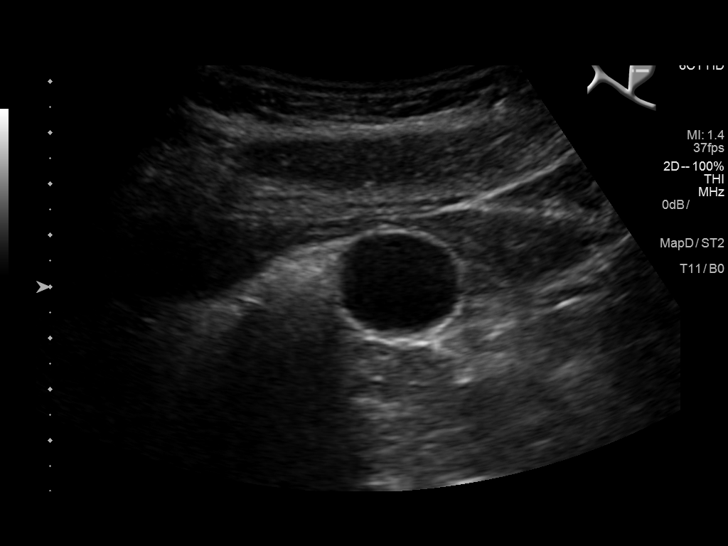
[im 16/34]
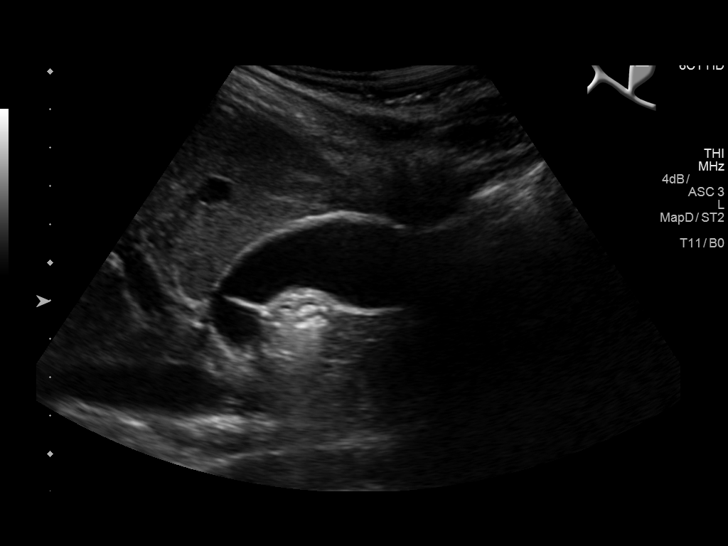
[im 18/34]
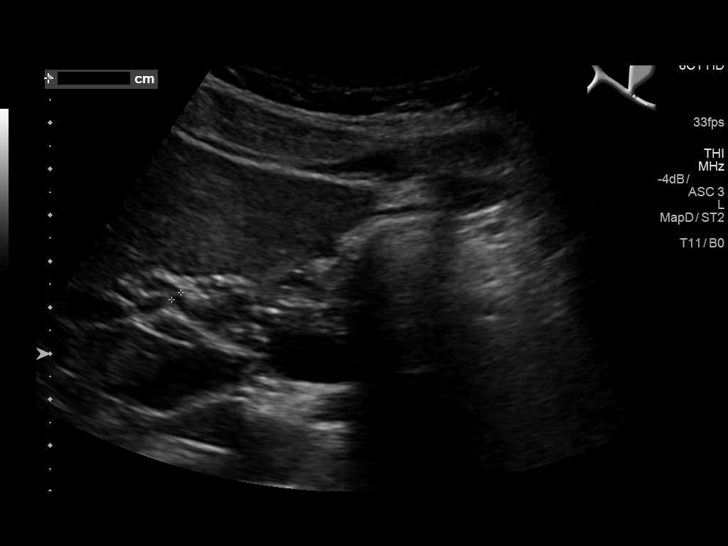
[im 21/34]
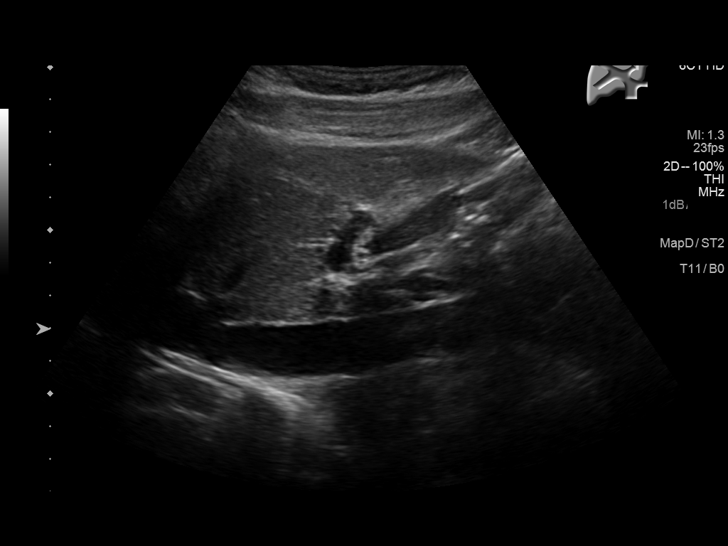
[im 23/34]
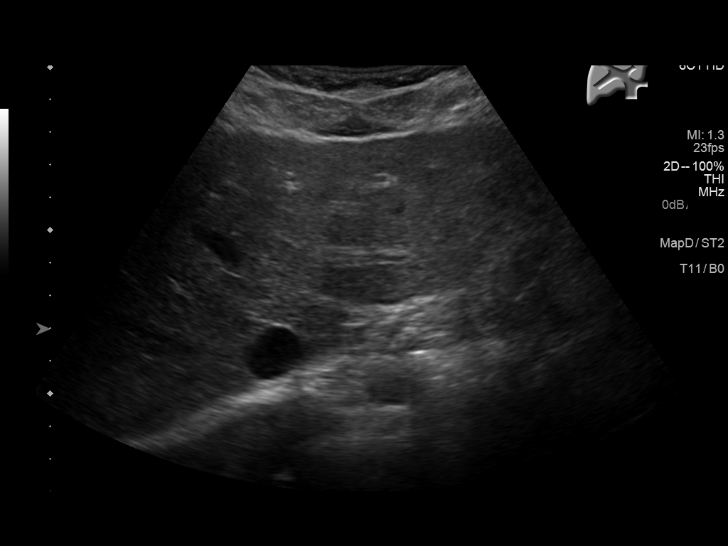
[im 25/34]
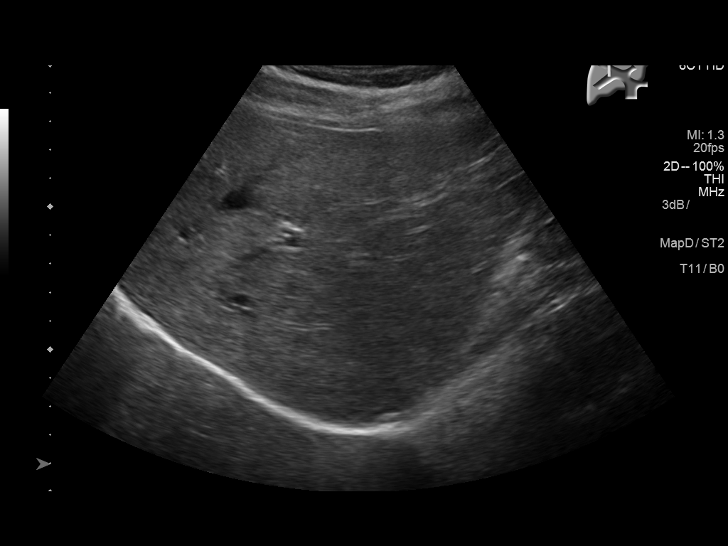
[im 28/34]
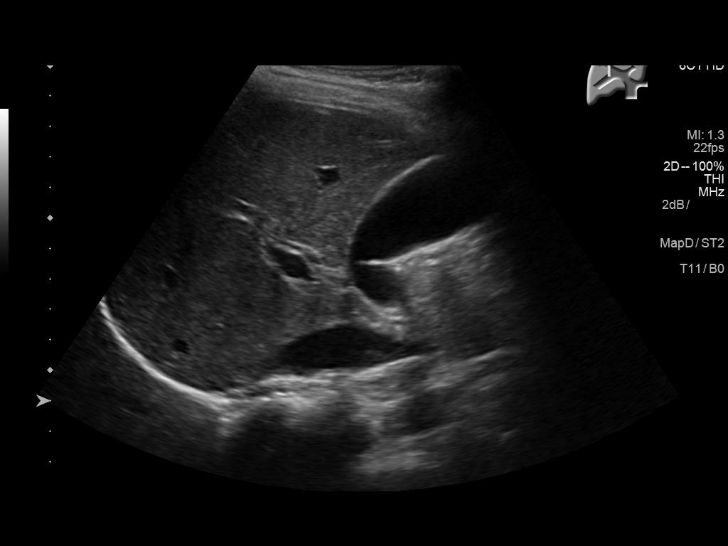
[im 31/34]
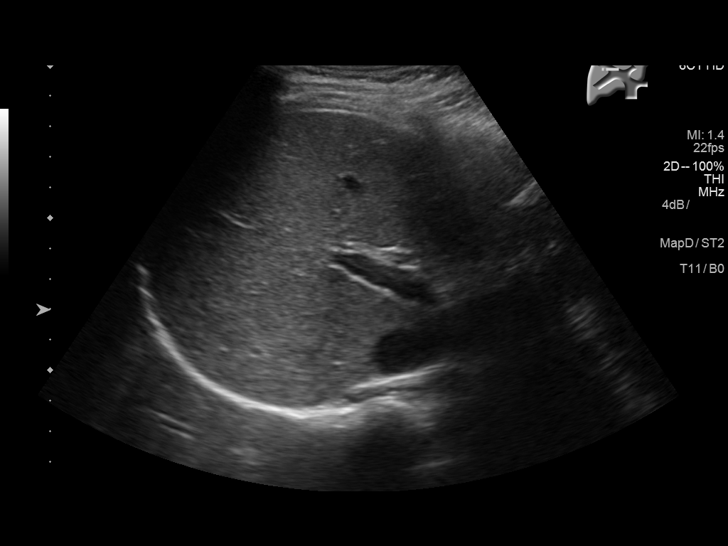
[im 34/34]
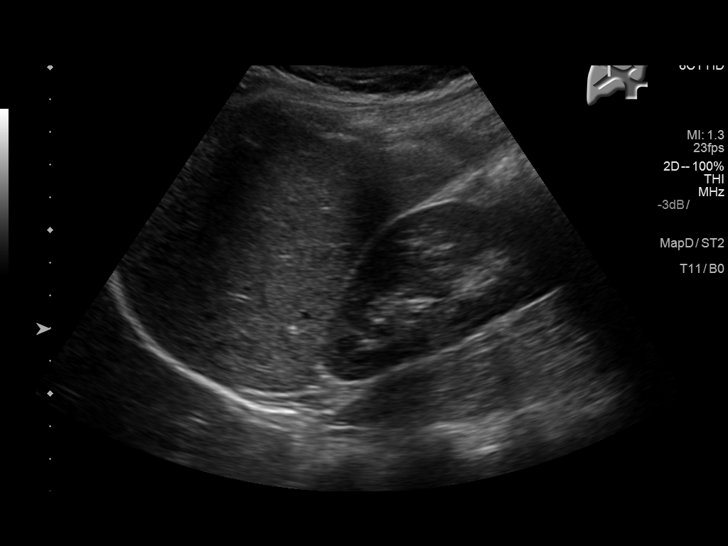

[14 of 25 positions shown; findings below may reference images not displayed]

FINDINGS: Gallbladder:

No gallstones or wall thickening visualized. No sonographic Murphy
sign noted by sonographer.

Common bile duct:

Diameter: 2.5 mm

Liver:

No focal lesion identified. Within normal limits in parenchymal
echogenicity.
IMPRESSION: Normal liver, gallbladder and bile ducts.

## 2017-09-01 ENCOUNTER — Encounter: Payer: Self-pay | Admitting: Obstetrics and Gynecology

## 2017-09-01 ENCOUNTER — Ambulatory Visit (INDEPENDENT_AMBULATORY_CARE_PROVIDER_SITE_OTHER): Payer: BLUE CROSS/BLUE SHIELD | Admitting: Obstetrics and Gynecology

## 2017-09-01 ENCOUNTER — Ambulatory Visit (INDEPENDENT_AMBULATORY_CARE_PROVIDER_SITE_OTHER): Payer: BLUE CROSS/BLUE SHIELD

## 2017-09-01 VITALS — BP 110/62 | Wt 135.0 lb

## 2017-09-01 DIAGNOSIS — Z362 Encounter for other antenatal screening follow-up: Secondary | ICD-10-CM | POA: Diagnosis not present

## 2017-09-01 DIAGNOSIS — Z113 Encounter for screening for infections with a predominantly sexual mode of transmission: Secondary | ICD-10-CM

## 2017-09-01 DIAGNOSIS — Z3400 Encounter for supervision of normal first pregnancy, unspecified trimester: Secondary | ICD-10-CM

## 2017-09-01 DIAGNOSIS — Z3A24 24 weeks gestation of pregnancy: Secondary | ICD-10-CM

## 2017-09-01 DIAGNOSIS — Z131 Encounter for screening for diabetes mellitus: Secondary | ICD-10-CM

## 2017-09-01 LAB — POCT URINALYSIS DIPSTICK OB
Glucose, UA: NEGATIVE — AB
PROTEIN: NEGATIVE

## 2017-09-01 NOTE — Progress Notes (Signed)
  Routine Prenatal Care Visit  Subjective  Sharon Soto is a 27 y.o. G1P0 at [redacted]w[redacted]d being seen today for ongoing prenatal care.  She is currently monitored for the following issues for this low-risk pregnancy and has Condyloma acuminata and Supervision of normal first pregnancy, antepartum on their problem list.  ----------------------------------------------------------------------------------- Patient reports no complaints.   Contractions: Not present. Vag. Bleeding: None.  Movement: Present. Denies leaking of fluid.  ----------------------------------------------------------------------------------- The following portions of the patient's history were reviewed and updated as appropriate: allergies, current medications, past family history, past medical history, past social history, past surgical history and problem list. Problem list updated.   Objective  Blood pressure 110/62, weight 135 lb (61.2 kg), last menstrual period 03/02/2017. Pregravid weight 128 lb (58.1 kg) Total Weight Gain 7 lb (3.175 kg) Urinalysis:      Fetal Status: Fetal Heart Rate (bpm): 133 Fundal Height: 24 cm Movement: Present     General:  Alert, oriented and cooperative. Patient is in no acute distress.  Skin: Skin is warm and dry. No rash noted.   Cardiovascular: Normal heart rate noted  Respiratory: Normal respiratory effort, no problems with respiration noted  Abdomen: Soft, gravid, appropriate for gestational age. Pain/Pressure: Absent     Pelvic:  Cervical exam deferred        Extremities: Normal range of motion.  Edema: None  Mental Status: Normal mood and affect. Normal behavior. Normal judgment and thought content.   Assessment   27 y.o. G1P0 at [redacted]w[redacted]d by  12/20/2017, by Ultrasound presenting for routine prenatal visit  Plan   Pregnancy#1 Problems (from 03/02/17 to present)    Problem Noted Resolved   Supervision of normal first pregnancy, antepartum 04/23/2017 by Rod Can, CNM No   Overview Addendum 05/04/2017  9:11 AM by Rexene Agent, [place] Prenatal Labs  Dating  Blood type: A/Positive/-- (04/04 0940)   Genetic Screen 1 Screen:    AFP:     Quad:     NIPS: Antibody:Negative (04/04 0940)  Anatomic [place]  Rubella: 3.89 (04/04 0940) Varicella: Immune  GTT Early:               Third trimester:  RPR: Non Reactive (04/04 0940)   Rhogam  HBsAg: Negative (04/04 0940)   TDaP vaccine                       Flu Shot: HIV: Non Reactive (04/04 0940)   Baby Food                                GBS:   Contraception  Pap: 04/23/2017, NILM  CBB     CS/VBAC NA   Support Person Alvester Chou            Preterm labor symptoms and general obstetric precautions including but not limited to vaginal bleeding, contractions, leaking of fluid and fetal movement were reviewed in detail with the patient. Please refer to After Visit Summary for other counseling recommendations.   Return in about 4 weeks (around 09/29/2017) for schedule 28 week labs and routine prenatal.  Prentice Docker, MD, [place], [facility] 09/01/2017 2:29 PM

## 2017-09-01 NOTE — Addendum Note (Signed)
Addended by: Leia Alf on: 09/01/2017 03:01 PM   Modules accepted: Orders

## 2017-09-29 ENCOUNTER — Ambulatory Visit (INDEPENDENT_AMBULATORY_CARE_PROVIDER_SITE_OTHER): Payer: BLUE CROSS/BLUE SHIELD | Admitting: Certified Nurse Midwife

## 2017-09-29 ENCOUNTER — Other Ambulatory Visit: Payer: BLUE CROSS/BLUE SHIELD

## 2017-09-29 VITALS — BP 96/58 | Wt 136.5 lb

## 2017-09-29 DIAGNOSIS — Z3400 Encounter for supervision of normal first pregnancy, unspecified trimester: Secondary | ICD-10-CM

## 2017-09-29 DIAGNOSIS — Z3A28 28 weeks gestation of pregnancy: Secondary | ICD-10-CM

## 2017-09-29 DIAGNOSIS — Z131 Encounter for screening for diabetes mellitus: Secondary | ICD-10-CM

## 2017-09-29 DIAGNOSIS — Z3403 Encounter for supervision of normal first pregnancy, third trimester: Secondary | ICD-10-CM

## 2017-09-29 DIAGNOSIS — Z113 Encounter for screening for infections with a predominantly sexual mode of transmission: Secondary | ICD-10-CM

## 2017-09-29 LAB — POCT URINALYSIS DIPSTICK OB: GLUCOSE, UA: NEGATIVE

## 2017-09-29 NOTE — Patient Instructions (Signed)

## 2017-09-29 NOTE — Progress Notes (Signed)
ROB/GTT NO concerns  Wants flu shots at next visit

## 2017-09-29 NOTE — Progress Notes (Signed)
ROB at 28wk2days: Doing well. Having mild stretching and pulling in the round ligament area. Recommend use of maternity support if round ligaments become more painful. Baby active. Declines flu shot this visit. Discussed signs and symptoms of preeclampsia Given prenatal class schedule Desires to breast feed. ROB in 2 weeks  Dalia Heading, CNM

## 2017-09-30 LAB — 28 WEEK RH+PANEL
BASOS ABS: 0 10*3/uL (ref 0.0–0.2)
Basos: 0 %
EOS (ABSOLUTE): 0.2 10*3/uL (ref 0.0–0.4)
Eos: 2 %
GESTATIONAL DIABETES SCREEN: 93 mg/dL (ref 65–139)
HEMATOCRIT: 33.3 % — AB (ref 34.0–46.6)
HIV Screen 4th Generation wRfx: NONREACTIVE
Hemoglobin: 11.2 g/dL (ref 11.1–15.9)
IMMATURE GRANULOCYTES: 2 %
Immature Grans (Abs): 0.2 10*3/uL — ABNORMAL HIGH (ref 0.0–0.1)
LYMPHS ABS: 2.7 10*3/uL (ref 0.7–3.1)
Lymphs: 28 %
MCH: 28.9 pg (ref 26.6–33.0)
MCHC: 33.6 g/dL (ref 31.5–35.7)
MCV: 86 fL (ref 79–97)
MONOCYTES: 9 %
Monocytes Absolute: 0.8 10*3/uL (ref 0.1–0.9)
NEUTROS PCT: 59 %
Neutrophils Absolute: 5.7 10*3/uL (ref 1.4–7.0)
PLATELETS: 272 10*3/uL (ref 150–450)
RBC: 3.87 x10E6/uL (ref 3.77–5.28)
RDW: 11.7 % — AB (ref 12.3–15.4)
RPR: NONREACTIVE
WBC: 9.7 10*3/uL (ref 3.4–10.8)

## 2017-10-13 ENCOUNTER — Ambulatory Visit (INDEPENDENT_AMBULATORY_CARE_PROVIDER_SITE_OTHER): Payer: BLUE CROSS/BLUE SHIELD | Admitting: Obstetrics and Gynecology

## 2017-10-13 ENCOUNTER — Encounter: Payer: Self-pay | Admitting: Obstetrics and Gynecology

## 2017-10-13 VITALS — BP 114/70 | Wt 142.0 lb

## 2017-10-13 DIAGNOSIS — Z3403 Encounter for supervision of normal first pregnancy, third trimester: Secondary | ICD-10-CM

## 2017-10-13 DIAGNOSIS — Z3A30 30 weeks gestation of pregnancy: Secondary | ICD-10-CM

## 2017-10-13 DIAGNOSIS — Z3400 Encounter for supervision of normal first pregnancy, unspecified trimester: Secondary | ICD-10-CM

## 2017-10-13 LAB — POCT URINALYSIS DIPSTICK OB
Glucose, UA: NEGATIVE
PROTEIN: NEGATIVE

## 2017-10-13 NOTE — Progress Notes (Signed)
  Routine Prenatal Care Visit  Subjective  Sharon Soto is a 27 y.o. G1P0 at [redacted]w[redacted]d being seen today for ongoing prenatal care.  She is currently monitored for the following issues for this low-risk pregnancy and has Condyloma acuminata and Supervision of normal first pregnancy, antepartum on their problem list.  ----------------------------------------------------------------------------------- Patient reports no complaints.   Contractions: Not present. Vag. Bleeding: None.  Movement: Present. Denies leaking of fluid.  ----------------------------------------------------------------------------------- The following portions of the patient's history were reviewed and updated as appropriate: allergies, current medications, past family history, past medical history, past social history, past surgical history and problem list. Problem list updated.   Objective  Blood pressure 114/70, weight 142 lb (64.4 kg), last menstrual period 03/02/2017. Pregravid weight 128 lb (58.1 kg) Total Weight Gain 14 lb (6.35 kg) Urinalysis: Urine Protein Negative  Urine Glucose Negative  Fetal Status: Fetal Heart Rate (bpm): 140 Fundal Height: 31 cm Movement: Present     General:  Alert, oriented and cooperative. Patient is in no acute distress.  Skin: Skin is warm and dry. No rash noted.   Cardiovascular: Normal heart rate noted  Respiratory: Normal respiratory effort, no problems with respiration noted  Abdomen: Soft, gravid, appropriate for gestational age. Pain/Pressure: Absent     Pelvic:  Cervical exam deferred        Extremities: Normal range of motion.  Edema: None  Mental Status: Normal mood and affect. Normal behavior. Normal judgment and thought content.   Assessment   27 y.o. G1P0 at [redacted]w[redacted]d by  12/20/2017, by Ultrasound presenting for routine prenatal visit  Plan   Pregnancy#1 Problems (from 03/02/17 to present)    Problem Noted Resolved   Supervision of normal first pregnancy, antepartum  04/23/2017 by Rod Can, CNM No   Overview Addendum 09/29/2017 10:59 AM by Dalia Heading, [place] Prenatal Labs  Dating  7wk 1day  ultrasound Blood type: A/Positive/-- (04/04 0940)   Genetic Screen declines Antibody:Negative (04/04 0940)  Anatomic [place] Normal anatomy. Female. Posterior placenta Rubella: 3.89 (04/04 0940) Varicella: Immune  GTT Early:               Third trimester:  RPR: Non Reactive (04/04 0940)   Rhogam  HBsAg: Negative (04/04 0940)   TDaP vaccine                       Flu Shot: HIV: Non Reactive (04/04 0940)   Baby Food           breast                     GBS:   Contraception  Pap: 04/23/2017, NILM  CBB     CS/VBAC NA   Support Person Alvester Chou               Preterm labor symptoms and general obstetric precautions including but not limited to vaginal bleeding, contractions, leaking of fluid and fetal movement were reviewed in detail with the patient. Please refer to After Visit Summary for other counseling recommendations.   - will consider TDAP at next visit  Return in about 2 weeks (around 10/27/2017) for Routine Prenatal Appointment.  Prentice Docker, MD, [facility], [facility] 10/15/2017 8:20 PM

## 2017-10-27 ENCOUNTER — Ambulatory Visit (INDEPENDENT_AMBULATORY_CARE_PROVIDER_SITE_OTHER): Payer: BLUE CROSS/BLUE SHIELD | Admitting: Obstetrics and Gynecology

## 2017-10-27 ENCOUNTER — Encounter: Payer: Self-pay | Admitting: Obstetrics and Gynecology

## 2017-10-27 VITALS — BP 118/74 | Wt 148.0 lb

## 2017-10-27 DIAGNOSIS — Z3400 Encounter for supervision of normal first pregnancy, unspecified trimester: Secondary | ICD-10-CM

## 2017-10-27 DIAGNOSIS — Z23 Encounter for immunization: Secondary | ICD-10-CM | POA: Diagnosis not present

## 2017-10-27 DIAGNOSIS — Z3A32 32 weeks gestation of pregnancy: Secondary | ICD-10-CM

## 2017-10-27 DIAGNOSIS — Z3403 Encounter for supervision of normal first pregnancy, third trimester: Secondary | ICD-10-CM

## 2017-10-27 NOTE — Progress Notes (Signed)
  Routine Prenatal Care Visit  Subjective  Sharon Soto is a 27 y.o. G1P0 at [redacted]w[redacted]d being seen today for ongoing prenatal care.  She is currently monitored for the following issues for this low-risk pregnancy and has Condyloma acuminata and Supervision of normal first pregnancy, antepartum on their problem list.  ----------------------------------------------------------------------------------- Patient reports no complaints.   Contractions: Not present.  .  Movement: Present. Denies leaking of fluid.  ----------------------------------------------------------------------------------- The following portions of the patient's history were reviewed and updated as appropriate: allergies, current medications, past family history, past medical history, past social history, past surgical history and problem list. Problem list updated.   Objective  Blood pressure 118/74, weight 148 lb (67.1 kg), last menstrual period 03/02/2017. Pregravid weight 128 lb (58.1 kg) Total Weight Gain 20 lb (9.072 kg) Urinalysis: Urine Protein    Urine Glucose    Fetal Status: Fetal Heart Rate (bpm): 130 Fundal Height: 33 cm Movement: Present     General:  Alert, oriented and cooperative. Patient is in no acute distress.  Skin: Skin is warm and dry. No rash noted.   Cardiovascular: Normal heart rate noted  Respiratory: Normal respiratory effort, no problems with respiration noted  Abdomen: Soft, gravid, appropriate for gestational age. Pain/Pressure: Absent     Pelvic:  Cervical exam deferred        Extremities: Normal range of motion.  Edema: None  Mental Status: Normal mood and affect. Normal behavior. Normal judgment and thought content.   Assessment   27 y.o. G1P0 at [redacted]w[redacted]d by  12/20/2017, by Ultrasound presenting for routine prenatal visit  Plan   Pregnancy#1 Problems (from 03/02/17 to present)    Problem Noted Resolved   Supervision of normal first pregnancy, antepartum 04/23/2017 by Rod Can, CNM  No   Overview Addendum 09/29/2017 10:59 AM by Dalia Heading, [place] Prenatal Labs  Dating  7wk 1day  ultrasound Blood type: A/Positive/-- (04/04 0940)   Genetic Screen declines Antibody:Negative (04/04 0940)  Anatomic [place] Normal anatomy. Female. Posterior placenta Rubella: 3.89 (04/04 0940) Varicella: Immune  GTT Early:               Third trimester:  RPR: Non Reactive (04/04 0940)   Rhogam  HBsAg: Negative (04/04 0940)   TDaP vaccine                       Flu Shot: HIV: Non Reactive (04/04 0940)   Baby Food           breast                     GBS:   Contraception  Pap: 04/23/2017, NILM  CBB     CS/VBAC NA   Support Person Alvester Chou               Preterm labor symptoms and general obstetric precautions including but not limited to vaginal bleeding, contractions, leaking of fluid and fetal movement were reviewed in detail with the patient. Please refer to After Visit Summary for other counseling recommendations.   - flu and TDaP shots today  Return in about 2 weeks (around 11/10/2017) for Routine Prenatal Appointment.  Prentice Docker, MD, [facility], [facility] 10/27/2017 2:46 PM

## 2017-10-27 NOTE — Addendum Note (Signed)
Addended by: Brien Few on: 10/27/2017 02:51 PM   Modules accepted: Orders

## 2017-11-10 ENCOUNTER — Encounter: Payer: BLUE CROSS/BLUE SHIELD | Admitting: Obstetrics and Gynecology

## 2017-11-24 ENCOUNTER — Encounter: Payer: Self-pay | Admitting: Obstetrics and Gynecology

## 2017-11-24 ENCOUNTER — Ambulatory Visit (INDEPENDENT_AMBULATORY_CARE_PROVIDER_SITE_OTHER): Payer: BLUE CROSS/BLUE SHIELD | Admitting: Obstetrics and Gynecology

## 2017-11-24 VITALS — BP 122/74 | Wt 152.0 lb

## 2017-11-24 DIAGNOSIS — Z3A36 36 weeks gestation of pregnancy: Secondary | ICD-10-CM

## 2017-11-24 DIAGNOSIS — B373 Candidiasis of vulva and vagina: Secondary | ICD-10-CM

## 2017-11-24 DIAGNOSIS — B3731 Acute candidiasis of vulva and vagina: Secondary | ICD-10-CM

## 2017-11-24 DIAGNOSIS — O9989 Other specified diseases and conditions complicating pregnancy, childbirth and the puerperium: Secondary | ICD-10-CM

## 2017-11-24 DIAGNOSIS — Z3400 Encounter for supervision of normal first pregnancy, unspecified trimester: Secondary | ICD-10-CM

## 2017-11-24 LAB — POCT WET PREP WITH KOH
Clue Cells Wet Prep HPF POC: NEGATIVE
KOH Prep POC: POSITIVE — AB
PH, VAGINAL: 5.5
TRICHOMONAS UA: NEGATIVE

## 2017-11-24 NOTE — Progress Notes (Signed)
  Routine Prenatal Care Visit  Subjective  Sharon Soto is a 27 y.o. G1P0 at [redacted]w[redacted]d being seen today for ongoing prenatal care.  She is currently monitored for the following issues for this low-risk pregnancy and has Condyloma acuminata and Supervision of normal first pregnancy, antepartum on their problem list.  ----------------------------------------------------------------------------------- Patient reports vaginal dryness. Worse than usual. Some mild irritation.     Contractions: Not present. Vag. Bleeding: None.  Movement: Present. Denies leaking of fluid.  ----------------------------------------------------------------------------------- The following portions of the patient's history were reviewed and updated as appropriate: allergies, current medications, past family history, past medical history, past social history, past surgical history and problem list. Problem list updated.   Objective  Blood pressure 122/74, weight 152 lb (68.9 kg), last menstrual period 03/02/2017. Pregravid weight 128 lb (58.1 kg) Total Weight Gain 24 lb (10.9 kg) Urinalysis: Urine Protein    Urine Glucose    Fetal Status: Fetal Heart Rate (bpm): 125 Fundal Height: 37 cm Movement: Present     General:  Alert, oriented and cooperative. Patient is in no acute distress.  Skin: Skin is warm and dry. No rash noted.   Cardiovascular: Normal heart rate noted  Respiratory: Normal respiratory effort, no problems with respiration noted  Abdomen: Soft, gravid, appropriate for gestational age. Pain/Pressure: Absent     Pelvic:  Cervical exam deferred        Extremities: Normal range of motion.  Edema: None  Mental Status: Normal mood and affect. Normal behavior. Normal judgment and thought content.   Wet Prep: PH: 5.5 Clue Cells: Negative Fungal elements: Positive Trichomonas: Negative   Assessment   27 y.o. G1P0 at [redacted]w[redacted]d by  12/20/2017, by Ultrasound presenting for routine prenatal visit  Plan    Pregnancy#1 Problems (from 03/02/17 to present)    Problem Noted Resolved   Supervision of normal first pregnancy, antepartum 04/23/2017 by Rod Can, CNM No   Overview Addendum 09/29/2017 10:59 AM by Dalia Heading, [place] Prenatal Labs  Dating  7wk 1day  ultrasound Blood type: A/Positive/-- (04/04 0940)   Genetic Screen declines Antibody:Negative (04/04 0940)  Anatomic [place] Normal anatomy. Female. Posterior placenta Rubella: 3.89 (04/04 0940) Varicella: Immune  GTT Early:               Third trimester:  RPR: Non Reactive (04/04 0940)   Rhogam  HBsAg: Negative (04/04 0940)   TDaP vaccine                       Flu Shot: HIV: Non Reactive (04/04 0940)   Baby Food           breast                     GBS:   Contraception  Pap: 04/23/2017, NILM  CBB     CS/VBAC NA   Support Person Alvester Chou               Preterm labor symptoms and general obstetric precautions including but not limited to vaginal bleeding, contractions, leaking of fluid and fetal movement were reviewed in detail with the patient. Please refer to After Visit Summary for other counseling recommendations.   - Monistat 7 for vulvovaginal candidiasis.  - GBS/ aptima (with trichomonas)  Return in about 1 week (around 12/01/2017) for Routine Prenatal Appointment.  Prentice Docker, MD, [facility], [facility] 11/24/2017 3:42 PM

## 2017-11-27 LAB — CHLAMYDIA/GONOCOCCUS/TRICHOMONAS, NAA
CHLAMYDIA BY NAA: NEGATIVE
Gonococcus by NAA: NEGATIVE
Trich vag by NAA: NEGATIVE

## 2017-11-27 LAB — STREP GP B NAA: Strep Gp B NAA: NEGATIVE

## 2017-11-30 ENCOUNTER — Ambulatory Visit (INDEPENDENT_AMBULATORY_CARE_PROVIDER_SITE_OTHER): Payer: BLUE CROSS/BLUE SHIELD | Admitting: Obstetrics and Gynecology

## 2017-11-30 ENCOUNTER — Encounter: Payer: Self-pay | Admitting: Obstetrics and Gynecology

## 2017-11-30 VITALS — BP 120/70 | Wt 152.0 lb

## 2017-11-30 DIAGNOSIS — O9989 Other specified diseases and conditions complicating pregnancy, childbirth and the puerperium: Secondary | ICD-10-CM

## 2017-11-30 DIAGNOSIS — Z3400 Encounter for supervision of normal first pregnancy, unspecified trimester: Secondary | ICD-10-CM

## 2017-11-30 DIAGNOSIS — Z3A37 37 weeks gestation of pregnancy: Secondary | ICD-10-CM

## 2017-11-30 DIAGNOSIS — A630 Anogenital (venereal) warts: Secondary | ICD-10-CM

## 2017-11-30 NOTE — Progress Notes (Signed)
  Routine Prenatal Care Visit  Subjective  Sharon Soto is a 27 y.o. G1P0 at [redacted]w[redacted]d being seen today for ongoing prenatal care.  She is currently monitored for the following issues for this low-risk pregnancy and has Condyloma acuminata and Supervision of normal first pregnancy, antepartum on their problem list.  ----------------------------------------------------------------------------------- Patient reports no complaints.   Contractions: Not present. Vag. Bleeding: None.  Movement: Present. Denies leaking of fluid.  ----------------------------------------------------------------------------------- The following portions of the patient's history were reviewed and updated as appropriate: allergies, current medications, past family history, past medical history, past social history, past surgical history and problem list. Problem list updated.   Objective  Blood pressure 120/70, weight 152 lb (68.9 kg), last menstrual period 03/02/2017. Pregravid weight 128 lb (58.1 kg) Total Weight Gain 24 lb (10.9 kg) Urinalysis: Urine Protein    Urine Glucose    Fetal Status: Fetal Heart Rate (bpm): 150 Fundal Height: 37 cm Movement: Present  Presentation: Vertex  General:  Alert, oriented and cooperative. Patient is in no acute distress.  Skin: Skin is warm and dry. No rash noted.   Cardiovascular: Normal heart rate noted  Respiratory: Normal respiratory effort, no problems with respiration noted  Abdomen: Soft, gravid, appropriate for gestational age. Pain/Pressure: Absent     Pelvic:  Cervical exam deferred        Extremities: Normal range of motion.  Edema: None  Mental Status: Normal mood and affect. Normal behavior. Normal judgment and thought content.   Assessment   27 y.o. G1P0 at [redacted]w[redacted]d by  12/20/2017, by Ultrasound presenting for routine prenatal visit  Plan   Pregnancy#1 Problems (from 03/02/17 to present)    Problem Noted Resolved   Supervision of normal first pregnancy,  antepartum 04/23/2017 by Rod Can, CNM No   Overview Addendum 09/29/2017 10:59 AM by Dalia Heading, [place] Prenatal Labs  Dating  7wk 1day  ultrasound Blood type: A/Positive/-- (04/04 0940)   Genetic Screen declines Antibody:Negative (04/04 0940)  Anatomic [place] Normal anatomy. Female. Posterior placenta Rubella: 3.89 (04/04 0940) Varicella: Immune  GTT Early:               Third trimester:  RPR: Non Reactive (04/04 0940)   Rhogam  HBsAg: Negative (04/04 0940)   TDaP vaccine                       Flu Shot: HIV: Non Reactive (04/04 0940)   Baby Food           breast                     GBS:   Contraception  Pap: 04/23/2017, NILM  CBB     CS/VBAC NA   Support Person Alvester Chou               Term labor symptoms and general obstetric precautions including but not limited to vaginal bleeding, contractions, leaking of fluid and fetal movement were reviewed in detail with the patient. Please refer to After Visit Summary for other counseling recommendations.   Return in about 1 week (around 12/07/2017) for Routine Prenatal Appointment.  Prentice Docker, MD, [facility], [facility] 11/30/2017 4:28 PM

## 2017-12-07 ENCOUNTER — Encounter: Payer: Self-pay | Admitting: Obstetrics and Gynecology

## 2017-12-07 ENCOUNTER — Ambulatory Visit (INDEPENDENT_AMBULATORY_CARE_PROVIDER_SITE_OTHER): Payer: BLUE CROSS/BLUE SHIELD | Admitting: Obstetrics and Gynecology

## 2017-12-07 VITALS — BP 118/74 | Wt 152.0 lb

## 2017-12-07 DIAGNOSIS — O3410 Maternal care for benign tumor of corpus uteri, unspecified trimester: Secondary | ICD-10-CM

## 2017-12-07 DIAGNOSIS — Z3400 Encounter for supervision of normal first pregnancy, unspecified trimester: Secondary | ICD-10-CM

## 2017-12-07 DIAGNOSIS — Z3A38 38 weeks gestation of pregnancy: Secondary | ICD-10-CM

## 2017-12-07 DIAGNOSIS — O3413 Maternal care for benign tumor of corpus uteri, third trimester: Secondary | ICD-10-CM

## 2017-12-07 DIAGNOSIS — D259 Leiomyoma of uterus, unspecified: Secondary | ICD-10-CM

## 2017-12-07 NOTE — Progress Notes (Signed)
  Routine Prenatal Care Visit  Subjective  Sharon Soto is a 27 y.o. G1P0 at [redacted]w[redacted]d being seen today for ongoing prenatal care.  She is currently monitored for the following issues for this low-risk pregnancy and has Condyloma acuminata; Supervision of normal first pregnancy, antepartum; and Uterine fibroid in pregnancy on their problem list.  ----------------------------------------------------------------------------------- Patient reports no complaints.   Contractions: Regular. Vag. Bleeding: None.  Movement: Present. Denies leaking of fluid.  ----------------------------------------------------------------------------------- The following portions of the patient's history were reviewed and updated as appropriate: allergies, current medications, past family history, past medical history, past social history, past surgical history and problem list. Problem list updated.   Objective  Blood pressure 118/74, weight 152 lb (68.9 kg), last menstrual period 03/02/2017. Pregravid weight 128 lb (58.1 kg) Total Weight Gain 24 lb (10.9 kg) Urinalysis: Urine Protein    Urine Glucose    Fetal Status: Fetal Heart Rate (bpm): 130 Fundal Height: 37 cm Movement: Present  Presentation: Vertex  General:  Alert, oriented and cooperative. Patient is in no acute distress.  Skin: Skin is warm and dry. No rash noted.   Cardiovascular: Normal heart rate noted  Respiratory: Normal respiratory effort, no problems with respiration noted  Abdomen: Soft, gravid, appropriate for gestational age. Pain/Pressure: Absent     Pelvic:  Cervical exam performed Dilation: 1 Effacement (%): 50 Station: -3  Extremities: Normal range of motion.  Edema: None  Mental Status: Normal mood and affect. Normal behavior. Normal judgment and thought content.   Assessment   27 y.o. G1P0 at [redacted]w[redacted]d by  12/20/2017, by Ultrasound presenting for routine prenatal visit  Plan   Pregnancy#1 Problems (from 03/02/17 to present)    Problem  Noted Resolved   Uterine fibroid in pregnancy 12/07/2017 by Will Bonnet, MD No   Supervision of normal first pregnancy, antepartum 04/23/2017 by Rod Can, CNM No   Overview Addendum 09/29/2017 10:59 AM by Dalia Heading, [place] Prenatal Labs  Dating  7wk 1day  ultrasound Blood type: A/Positive/-- (04/04 0940)   Genetic Screen declines Antibody:Negative (04/04 0940)  Anatomic [place] Normal anatomy. Female. Posterior placenta Rubella: 3.89 (04/04 0940) Varicella: Immune  GTT Early:               Third trimester:  RPR: Non Reactive (04/04 0940)   Rhogam  HBsAg: Negative (04/04 0940)   TDaP vaccine                       Flu Shot: HIV: Non Reactive (04/04 0940)   Baby Food           breast                     GBS:   Contraception  Pap: 04/23/2017, NILM  CBB     CS/VBAC NA   Support Person Alvester Chou               Term labor symptoms and general obstetric precautions including but not limited to vaginal bleeding, contractions, leaking of fluid and fetal movement were reviewed in detail with the patient. Please refer to After Visit Summary for other counseling recommendations.   Return in about 1 week (around 12/14/2017) for Routine Prenatal Appointment.  Prentice Docker, MD, [facility], [facility] 12/07/2017 2:32 PM

## 2017-12-14 ENCOUNTER — Encounter: Payer: Self-pay | Admitting: Obstetrics and Gynecology

## 2017-12-14 ENCOUNTER — Ambulatory Visit (INDEPENDENT_AMBULATORY_CARE_PROVIDER_SITE_OTHER): Payer: BLUE CROSS/BLUE SHIELD | Admitting: Obstetrics and Gynecology

## 2017-12-14 VITALS — BP 114/68 | Wt 158.0 lb

## 2017-12-14 DIAGNOSIS — Z3400 Encounter for supervision of normal first pregnancy, unspecified trimester: Secondary | ICD-10-CM

## 2017-12-14 DIAGNOSIS — O3413 Maternal care for benign tumor of corpus uteri, third trimester: Secondary | ICD-10-CM

## 2017-12-14 DIAGNOSIS — D251 Intramural leiomyoma of uterus: Secondary | ICD-10-CM

## 2017-12-14 DIAGNOSIS — Z3A39 39 weeks gestation of pregnancy: Secondary | ICD-10-CM

## 2017-12-14 LAB — POCT URINALYSIS DIPSTICK OB: Glucose, UA: NEGATIVE

## 2017-12-14 NOTE — Progress Notes (Signed)
Sharon Soto C/o some braxton hicks Desires cervical check

## 2017-12-14 NOTE — Progress Notes (Signed)
    Routine Prenatal Care Visit  Subjective  Sharon Soto is a 27 y.o. G1P0 at [redacted]w[redacted]d being seen today for ongoing prenatal care.  She is currently monitored for the following issues for this low-risk pregnancy and has Condyloma acuminata; Supervision of normal first pregnancy, antepartum; and Uterine fibroid in pregnancy on their problem list.  ----------------------------------------------------------------------------------- Patient reports no complaints.   Contractions: Irregular. Vag. Bleeding: None.  Movement: Present. Denies leaking of fluid.  ----------------------------------------------------------------------------------- The following portions of the patient's history were reviewed and updated as appropriate: allergies, current medications, past family history, past medical history, past social history, past surgical history and problem list. Problem list updated.   Objective  Blood pressure 114/68, weight 158 lb (71.7 kg), last menstrual period 03/02/2017. Pregravid weight 128 lb (58.1 kg) Total Weight Gain 30 lb (13.6 kg) Urinalysis:      Fetal Status: Fetal Heart Rate (bpm): 120 Fundal Height: 39 cm Movement: Present     General:  Alert, oriented and cooperative. Patient is in no acute distress.  Skin: Skin is warm and dry. No rash noted.   Cardiovascular: Normal heart rate noted  Respiratory: Normal respiratory effort, no problems with respiration noted  Abdomen: Soft, gravid, appropriate for gestational age. Pain/Pressure: Absent     Pelvic:  Cervical exam performed Dilation: 1 Effacement (%): 50 Station: -3  Extremities: Normal range of motion.  Edema: None  Mental Status: Normal mood and affect. Normal behavior. Normal judgment and thought content.     Assessment   27 y.o. G1P0 at [redacted]w[redacted]d by  12/20/2017, by Ultrasound presenting for routine prenatal visit  Plan   Pregnancy#1 Problems (from 03/02/17 to present)    Problem Noted Resolved   Uterine fibroid in  pregnancy 12/07/2017 by Will Bonnet, MD No   Supervision of normal first pregnancy, antepartum 04/23/2017 by Rod Can, CNM No   Overview Addendum 09/29/2017 10:59 AM by Dalia Heading, [place] Prenatal Labs  Dating  7wk 1day  ultrasound Blood type: A/Positive/-- (04/04 0940)   Genetic Screen declines Antibody:Negative (04/04 0940)  Anatomic [place] Normal anatomy. Female. Posterior placenta Rubella: 3.89 (04/04 0940) Varicella: Immune  GTT Early:               Third trimester:  RPR: Non Reactive (04/04 0940)   Rhogam  HBsAg: Negative (04/04 0940)   TDaP vaccine                       Flu Shot: HIV: Non Reactive (04/04 0940)   Baby Food           breast                     GBS:   Contraception  Pap: 04/23/2017, NILM  CBB     CS/VBAC NA   Support Person Alvester Chou               Gestational age appropriate obstetric precautions including but not limited to vaginal bleeding, contractions, leaking of fluid and fetal movement were reviewed in detail with the patient.    Membranes swept with maternal approval.   Return in about 1 week (around 12/21/2017) for ROB (preference with [facility]).  Homero Fellers MD [facility], [facility] 12/14/2017, 2:46 PM

## 2017-12-20 ENCOUNTER — Inpatient Hospital Stay
Admission: EM | Admit: 2017-12-20 | Discharge: 2017-12-23 | DRG: 786 | Disposition: A | Discharge status: Home / Self Care | Payer: BLUE CROSS/BLUE SHIELD | Attending: Certified Nurse Midwife | Admitting: Certified Nurse Midwife

## 2017-12-20 ENCOUNTER — Inpatient Hospital Stay: Payer: BLUE CROSS/BLUE SHIELD | Admitting: Anesthesiology

## 2017-12-20 ENCOUNTER — Other Ambulatory Visit: Payer: Self-pay

## 2017-12-20 ENCOUNTER — Encounter
Admission: EM | Disposition: A | Discharge status: Home / Self Care | Payer: Self-pay | Source: Home / Self Care | Attending: Certified Nurse Midwife

## 2017-12-20 DIAGNOSIS — D251 Intramural leiomyoma of uterus: Secondary | ICD-10-CM | POA: Diagnosis present

## 2017-12-20 DIAGNOSIS — D62 Acute posthemorrhagic anemia: Secondary | ICD-10-CM | POA: Diagnosis not present

## 2017-12-20 DIAGNOSIS — O3413 Maternal care for benign tumor of corpus uteri, third trimester: Secondary | ICD-10-CM | POA: Diagnosis present

## 2017-12-20 DIAGNOSIS — O9081 Anemia of the puerperium: Secondary | ICD-10-CM | POA: Diagnosis not present

## 2017-12-20 DIAGNOSIS — Z3483 Encounter for supervision of other normal pregnancy, third trimester: Secondary | ICD-10-CM | POA: Diagnosis present

## 2017-12-20 DIAGNOSIS — O411230 Chorioamnionitis, third trimester, not applicable or unspecified: Secondary | ICD-10-CM | POA: Diagnosis present

## 2017-12-20 DIAGNOSIS — Z3A40 40 weeks gestation of pregnancy: Secondary | ICD-10-CM

## 2017-12-20 DIAGNOSIS — Z3400 Encounter for supervision of normal first pregnancy, unspecified trimester: Secondary | ICD-10-CM

## 2017-12-20 DIAGNOSIS — R1011 Right upper quadrant pain: Secondary | ICD-10-CM

## 2017-12-20 DIAGNOSIS — Z87891 Personal history of nicotine dependence: Secondary | ICD-10-CM | POA: Diagnosis not present

## 2017-12-20 DIAGNOSIS — D259 Leiomyoma of uterus, unspecified: Secondary | ICD-10-CM

## 2017-12-20 DIAGNOSIS — O3410 Maternal care for benign tumor of corpus uteri, unspecified trimester: Secondary | ICD-10-CM

## 2017-12-20 HISTORY — DX: Leiomyoma of uterus, unspecified: D25.9

## 2017-12-20 HISTORY — DX: Anogenital (venereal) warts: A63.0

## 2017-12-20 HISTORY — DX: Leiomyoma of uterus, unspecified: O34.10

## 2017-12-20 LAB — CBC
HCT: 37.5 % (ref 36.0–46.0)
Hemoglobin: 12 g/dL (ref 12.0–15.0)
MCH: 27.6 pg (ref 26.0–34.0)
MCHC: 32 g/dL (ref 30.0–36.0)
MCV: 86.4 fL (ref 80.0–100.0)
Platelets: 205 10*3/uL (ref 150–400)
RBC: 4.34 MIL/uL (ref 3.87–5.11)
RDW: 15.6 % — ABNORMAL HIGH (ref 11.5–15.5)
WBC: 10.1 10*3/uL (ref 4.0–10.5)
nRBC: 0 % (ref 0.0–0.2)

## 2017-12-20 LAB — TYPE AND SCREEN
ABO/RH(D): A POS
Antibody Screen: NEGATIVE

## 2017-12-20 SURGERY — Surgical Case
Anesthesia: Epidural | Site: Abdomen

## 2017-12-20 MED ORDER — EPHEDRINE 5 MG/ML INJ: 10.0000 mg | INTRAVENOUS | Status: DC | PRN

## 2017-12-20 MED ORDER — FENTANYL 2.5 MCG/ML W/ROPIVACAINE 0.15% IN NS 100 ML EPIDURAL (ARMC)
12.0000 mL/h | EPIDURAL | Status: DC
  Administered 2017-12-20: 12 mL/h via EPIDURAL
  Filled 2017-12-20: qty 100

## 2017-12-20 MED ORDER — FLEET ENEMA 7-19 GM/118ML RE ENEM: 1.0000 | ENEMA | Freq: Every day | RECTAL | Status: DC | PRN

## 2017-12-20 MED ORDER — OXYTOCIN 40 UNITS IN LACTATED RINGERS INFUSION - SIMPLE MED
1.0000 m[IU]/min | INTRAVENOUS | Status: DC
  Administered 2017-12-20: 500 mL via INTRAVENOUS

## 2017-12-20 MED ORDER — ONDANSETRON HCL 4 MG/2ML IJ SOLN
4.0000 mg | Freq: Four times a day (QID) | INTRAMUSCULAR | Status: DC | PRN
  Administered 2017-12-20: 4 mg via INTRAVENOUS

## 2017-12-20 MED ORDER — SOD CITRATE-CITRIC ACID 500-334 MG/5ML PO SOLN
ORAL | Status: AC
  Administered 2017-12-20: 30 mL via ORAL
  Filled 2017-12-20: qty 30

## 2017-12-20 MED ORDER — DIPHENHYDRAMINE HCL 50 MG/ML IJ SOLN: 12.5000 mg | INTRAMUSCULAR | Status: DC | PRN

## 2017-12-20 MED ORDER — BUPIVACAINE HCL (PF) 0.25 % IJ SOLN
INTRAMUSCULAR | Status: DC | PRN
  Administered 2017-12-20: 5 mL via EPIDURAL

## 2017-12-20 MED ORDER — OXYCODONE-ACETAMINOPHEN 5-325 MG PO TABS: 2.0000 | ORAL_TABLET | ORAL | Status: DC | PRN

## 2017-12-20 MED ORDER — CEFAZOLIN SODIUM-DEXTROSE 2-4 GM/100ML-% IV SOLN
2.0000 g | INTRAVENOUS | Status: DC
  Filled 2017-12-20: qty 100

## 2017-12-20 MED ORDER — SODIUM CHLORIDE 0.9 % IV SOLN
INTRAVENOUS | Status: AC
  Filled 2017-12-20: qty 2000

## 2017-12-20 MED ORDER — OXYTOCIN 40 UNITS IN LACTATED RINGERS INFUSION - SIMPLE MED
2.5000 [IU]/h | INTRAVENOUS | Status: AC
  Filled 2017-12-20: qty 1000

## 2017-12-20 MED ORDER — ACETAMINOPHEN 500 MG PO TABS
1000.0000 mg | ORAL_TABLET | Freq: Four times a day (QID) | ORAL | Status: DC | PRN
  Administered 2017-12-20: 1000 mg via ORAL

## 2017-12-20 MED ORDER — SENNOSIDES-DOCUSATE SODIUM 8.6-50 MG PO TABS
2.0000 | ORAL_TABLET | ORAL | Status: DC
  Administered 2017-12-21 – 2017-12-23 (×3): 2 via ORAL
  Filled 2017-12-20 (×4): qty 2

## 2017-12-20 MED ORDER — LIDOCAINE HCL (PF) 1 % IJ SOLN
INTRAMUSCULAR | Status: AC
  Filled 2017-12-20: qty 30

## 2017-12-20 MED ORDER — WITCH HAZEL-GLYCERIN EX PADS: 1.0000 "application " | MEDICATED_PAD | CUTANEOUS | Status: DC | PRN

## 2017-12-20 MED ORDER — MISOPROSTOL 200 MCG PO TABS
ORAL_TABLET | ORAL | Status: AC
  Filled 2017-12-20: qty 4

## 2017-12-20 MED ORDER — AMMONIA AROMATIC IN INHA
RESPIRATORY_TRACT | Status: AC
  Filled 2017-12-20: qty 10

## 2017-12-20 MED ORDER — LIDOCAINE HCL (CARDIAC) PF 100 MG/5ML IV SOSY
PREFILLED_SYRINGE | INTRAVENOUS | Status: DC | PRN
  Administered 2017-12-20 (×3): 5 mL via INTRAVENOUS

## 2017-12-20 MED ORDER — OXYTOCIN BOLUS FROM INFUSION: 500.0000 mL | Freq: Once | INTRAVENOUS | Status: DC

## 2017-12-20 MED ORDER — PRENATAL MULTIVITAMIN CH
1.0000 | ORAL_TABLET | Freq: Every day | ORAL | Status: DC
  Administered 2017-12-21 – 2017-12-23 (×3): 1 via ORAL
  Filled 2017-12-20 (×3): qty 1

## 2017-12-20 MED ORDER — COCONUT OIL OIL: 1.0000 "application " | TOPICAL_OIL | Status: DC | PRN

## 2017-12-20 MED ORDER — BUPIVACAINE 0.25 % ON-Q PUMP DUAL CATH 400 ML
400.0000 mL | INJECTION | Status: DC
  Filled 2017-12-20: qty 400

## 2017-12-20 MED ORDER — BUPIVACAINE HCL (PF) 0.5 % IJ SOLN
INTRAMUSCULAR | Status: DC | PRN
  Administered 2017-12-20: 10 mL

## 2017-12-20 MED ORDER — CEFAZOLIN SODIUM-DEXTROSE 2-3 GM-%(50ML) IV SOLR
INTRAVENOUS | Status: DC | PRN
  Administered 2017-12-20: 2 g via INTRAVENOUS

## 2017-12-20 MED ORDER — BUPIVACAINE ON-Q PAIN PUMP (FOR ORDER SET NO CHG)
INJECTION | Status: DC
  Filled 2017-12-20: qty 1

## 2017-12-20 MED ORDER — FENTANYL CITRATE (PF) 100 MCG/2ML IJ SOLN
25.0000 ug | INTRAMUSCULAR | Status: DC | PRN
  Administered 2017-12-20 (×5): 25 ug via INTRAVENOUS
  Filled 2017-12-20 (×2): qty 2

## 2017-12-20 MED ORDER — FERROUS SULFATE 325 (65 FE) MG PO TABS
325.0000 mg | ORAL_TABLET | Freq: Two times a day (BID) | ORAL | Status: DC
  Administered 2017-12-21 (×2): 325 mg via ORAL
  Filled 2017-12-20 (×2): qty 1

## 2017-12-20 MED ORDER — LACTATED RINGERS IV SOLN: 500.0000 mL | Freq: Once | INTRAVENOUS | Status: DC

## 2017-12-20 MED ORDER — PHENYLEPHRINE 40 MCG/ML (10ML) SYRINGE FOR IV PUSH (FOR BLOOD PRESSURE SUPPORT): 80.0000 ug | PREFILLED_SYRINGE | INTRAVENOUS | Status: DC | PRN

## 2017-12-20 MED ORDER — LIDOCAINE HCL (PF) 1 % IJ SOLN: 30.0000 mL | INTRAMUSCULAR | Status: DC | PRN

## 2017-12-20 MED ORDER — IBUPROFEN 600 MG PO TABS: 600.0000 mg | ORAL_TABLET | Freq: Four times a day (QID) | ORAL | Status: DC

## 2017-12-20 MED ORDER — OXYTOCIN 40 UNITS IN LACTATED RINGERS INFUSION - SIMPLE MED
1.0000 m[IU]/min | INTRAVENOUS | Status: DC
  Administered 2017-12-20: 1 m[IU]/min via INTRAVENOUS

## 2017-12-20 MED ORDER — MISOPROSTOL 200 MCG PO TABS: 800.0000 ug | ORAL_TABLET | Freq: Once | ORAL | Status: DC | PRN

## 2017-12-20 MED ORDER — ZOLPIDEM TARTRATE 5 MG PO TABS: 5.0000 mg | ORAL_TABLET | Freq: Every evening | ORAL | Status: DC | PRN

## 2017-12-20 MED ORDER — METOCLOPRAMIDE HCL 10 MG PO TABS
5.0000 mg | ORAL_TABLET | Freq: Three times a day (TID) | ORAL | Status: DC
  Filled 2017-12-20 (×2): qty 1

## 2017-12-20 MED ORDER — OXYTOCIN 40 UNITS IN LACTATED RINGERS INFUSION - SIMPLE MED
INTRAVENOUS | Status: AC
  Filled 2017-12-20: qty 1000

## 2017-12-20 MED ORDER — OXYTOCIN 10 UNIT/ML IJ SOLN
INTRAMUSCULAR | Status: AC
  Filled 2017-12-20: qty 2

## 2017-12-20 MED ORDER — LACTATED RINGERS IV SOLN: INTRAVENOUS | Status: DC

## 2017-12-20 MED ORDER — OXYTOCIN 40 UNITS IN LACTATED RINGERS INFUSION - SIMPLE MED
2.5000 [IU]/h | INTRAVENOUS | Status: DC
  Filled 2017-12-20: qty 1000

## 2017-12-20 MED ORDER — PROPOFOL 10 MG/ML IV BOLUS
INTRAVENOUS | Status: AC
  Filled 2017-12-20: qty 20

## 2017-12-20 MED ORDER — TERBUTALINE SULFATE 1 MG/ML IJ SOLN: 0.2500 mg | Freq: Once | INTRAMUSCULAR | Status: DC | PRN

## 2017-12-20 MED ORDER — LACTATED RINGERS IV SOLN
INTRAVENOUS | Status: DC
  Administered 2017-12-20 (×2): via INTRAVENOUS

## 2017-12-20 MED ORDER — ACETAMINOPHEN 325 MG PO TABS: 650.0000 mg | ORAL_TABLET | ORAL | Status: DC | PRN

## 2017-12-20 MED ORDER — DIBUCAINE 1 % RE OINT: 1.0000 "application " | TOPICAL_OINTMENT | RECTAL | Status: DC | PRN

## 2017-12-20 MED ORDER — BUPIVACAINE HCL (PF) 0.5 % IJ SOLN
INTRAMUSCULAR | Status: AC
  Filled 2017-12-20: qty 30

## 2017-12-20 MED ORDER — ONDANSETRON HCL 4 MG/2ML IJ SOLN: 4.0000 mg | Freq: Once | INTRAMUSCULAR | Status: DC | PRN

## 2017-12-20 MED ORDER — BISACODYL 10 MG RE SUPP
10.0000 mg | Freq: Every day | RECTAL | Status: DC | PRN
  Filled 2017-12-20: qty 1

## 2017-12-20 MED ORDER — SOD CITRATE-CITRIC ACID 500-334 MG/5ML PO SOLN
30.0000 mL | ORAL | Status: AC
  Administered 2017-12-20: 30 mL via ORAL

## 2017-12-20 MED ORDER — AMMONIA AROMATIC IN INHA: 0.3000 mL | Freq: Once | RESPIRATORY_TRACT | Status: DC | PRN

## 2017-12-20 MED ORDER — MENTHOL 3 MG MT LOZG
1.0000 | LOZENGE | OROMUCOSAL | Status: DC | PRN
  Filled 2017-12-20: qty 9

## 2017-12-20 MED ORDER — IBUPROFEN 600 MG PO TABS
600.0000 mg | ORAL_TABLET | Freq: Four times a day (QID) | ORAL | Status: DC
  Administered 2017-12-20 – 2017-12-23 (×12): 600 mg via ORAL
  Filled 2017-12-20 (×12): qty 1

## 2017-12-20 MED ORDER — LACTATED RINGERS IV SOLN: 500.0000 mL | INTRAVENOUS | Status: DC | PRN

## 2017-12-20 MED ORDER — SIMETHICONE 80 MG PO CHEW: 80.0000 mg | CHEWABLE_TABLET | Freq: Four times a day (QID) | ORAL | Status: DC | PRN

## 2017-12-20 MED ORDER — FENTANYL 2.5 MCG/ML W/ROPIVACAINE 0.15% IN NS 100 ML EPIDURAL (ARMC)
EPIDURAL | Status: AC
  Filled 2017-12-20: qty 100

## 2017-12-20 MED ORDER — OXYCODONE-ACETAMINOPHEN 5-325 MG PO TABS
1.0000 | ORAL_TABLET | ORAL | Status: DC | PRN
  Administered 2017-12-20 – 2017-12-23 (×9): 1 via ORAL
  Filled 2017-12-20 (×9): qty 1

## 2017-12-20 MED ORDER — DIPHENHYDRAMINE HCL 25 MG PO CAPS: 25.0000 mg | ORAL_CAPSULE | Freq: Four times a day (QID) | ORAL | Status: DC | PRN

## 2017-12-20 MED ORDER — ACETAMINOPHEN 500 MG PO TABS
ORAL_TABLET | ORAL | Status: AC
  Administered 2017-12-20: 1000 mg via ORAL
  Filled 2017-12-20: qty 2

## 2017-12-20 SURGICAL SUPPLY — 29 items
CANISTER SUCT 3000ML PPV (MISCELLANEOUS) ×3 IMPLANT
CHLORAPREP W/TINT 26ML (MISCELLANEOUS) ×6 IMPLANT
DERMABOND ADVANCED (GAUZE/BANDAGES/DRESSINGS) ×2
DERMABOND ADVANCED .7 DNX12 (GAUZE/BANDAGES/DRESSINGS) ×1 IMPLANT
DRSG OPSITE POSTOP 4X10 (GAUZE/BANDAGES/DRESSINGS) ×3 IMPLANT
ELECT CAUTERY BLADE 6.4 (BLADE) ×3 IMPLANT
ELECT REM PT RETURN 9FT ADLT (ELECTROSURGICAL) ×3
ELECTRODE REM PT RTRN 9FT ADLT (ELECTROSURGICAL) ×1 IMPLANT
GLOVE BIO SURGEON STRL SZ7 (GLOVE) ×9 IMPLANT
GLOVE BIOGEL PI IND STRL 6.5 (GLOVE) ×1 IMPLANT
GLOVE BIOGEL PI INDICATOR 6.5 (GLOVE) ×2
GLOVE INDICATOR 7.5 STRL GRN (GLOVE) ×9 IMPLANT
GLOVE SKINSENSE NS SZ6.5 (GLOVE) ×2
GLOVE SKINSENSE STRL SZ6.5 (GLOVE) ×1 IMPLANT
GOWN STRL REUS W/ TWL LRG LVL3 (GOWN DISPOSABLE) ×1 IMPLANT
GOWN STRL REUS W/ TWL XL LVL3 (GOWN DISPOSABLE) ×2 IMPLANT
GOWN STRL REUS W/TWL LRG LVL3 (GOWN DISPOSABLE) ×2
GOWN STRL REUS W/TWL XL LVL3 (GOWN DISPOSABLE) ×4
NS IRRIG 1000ML POUR BTL (IV SOLUTION) ×3 IMPLANT
PACK C SECTION AR (MISCELLANEOUS) ×3 IMPLANT
PAD OB MATERNITY 4.3X12.25 (PERSONAL CARE ITEMS) ×6 IMPLANT
PAD PREP 24X41 OB/GYN DISP (PERSONAL CARE ITEMS) ×3 IMPLANT
RETRACTOR WND ALEXIS-O 25 LRG (MISCELLANEOUS) ×1 IMPLANT
RTRCTR WOUND ALEXIS O 25CM LRG (MISCELLANEOUS) ×3
SUT MNCRL AB 4-0 PS2 18 (SUTURE) ×6 IMPLANT
SUT PLAIN 3-0 (SUTURE) ×3 IMPLANT
SUT VIC AB 0 CT1 36 (SUTURE) ×9 IMPLANT
SUT VIC AB 2-0 CT1 36 (SUTURE) ×3 IMPLANT
SYR 30ML LL (SYRINGE) ×6 IMPLANT

## 2017-12-20 NOTE — Op Note (Addendum)
Cesarean Section Procedure Note 12/20/17  Pre-operative Diagnosis:  1. Arrest of Dilation  2. Chorioamnionitis Post-operative Diagnosis: same, delivered. Procedure: Primary Low Transverse Cesarean Section Surgeon: Adrian Prows MD   Assistant(s): None Anesthesia: Epidural Estimated Blood Loss: 673 cc Complications: None; patient tolerated the procedure well.   Disposition: PACU - hemodynamically stable. Condition: stable   Findings: A female infant in the cephalic presentation. Amniotic fluid - clear   Birth weight: 7 lbs 7oz Apgars of 8 and 9.  Intact placenta with a three-vessel cord. Fibroid uterus large 6cm intramural fibroid located in the anterior middle of the uterus, tubes and ovaries bilaterally. No intraabdominal adhesions were noted.   Procedure Details    The patient was taken to operating room, identified as the correct patient and the procedure verified as C-Section Delivery. A time out was held and the above information confirmed. After induction of anesthesia, the patient was draped and prepped in the usual sterile manner. A Pfannenstiel incision was made and carried down through the subcutaneous tissue to the fascia. Fascial incision was made and extended transversely with the Mayo scissors. The fascia was separated from the underlying rectus tissue superiorly and inferiorly. The peritoneum was identified and entered bluntly. Peritoneal incision was extended longitudinally. A low transverse hysterotomy was made. The fetus was delivered atraumatically. The umbilical cord was clamped x2 and cut and the infant was handed to the awaiting pediatricians. The placenta was removed intact and appeared normal with a 3-vessel cord. The uterus was exteriorized and cleared of all clot and debris. The hysterotomy was closed with running sutures of 0 Vicryl suture. A second imbricating layer was placed with the same suture. Excellent hemostasis was observed. The uterus was returned to  the abdomen. The pelvis was irrigated and again, excellent hemostasis was noted. The peritoneum was closed with a running stitch of 2-0 Vicryl. The On Q Pain pump System was then placed.  Trocars were placed through the abdominal wall into the subfascial space and these were used to thread the silver soaker cathaters into place.The rectus muscles were inspected and were hemostatic. The rectus fascia was then reapproximated with running sutures of 0-vicryl, with careful placement not to incorporate the cathaters. Subcutaneous tissues are then irrigated with saline and hemostasis assured with the bovie. The subcutaneous fat was approximated with 3-0 plain and a running stitch.  Skin was then closed with 4-0 monocryl suture in a subcuticular fashion followed by skin adhesive. The cathaters are flushed each with 5 mL of Bupivicaine and stabilized into place with dressing. Instrument, sponge, and needle counts were correct prior to the abdominal closure and at the conclusion of the case.  The patient tolerated the procedure well and was transferred to the recovery room in stable condition.   Homero Fellers MD [facility], [facility] 12/20/17 8:57 PM

## 2017-12-20 NOTE — Anesthesia Preprocedure Evaluation (Signed)
Anesthesia Evaluation  Patient identified by MRN, date of birth, ID band Patient awake    Reviewed: Allergy & Precautions, NPO status , Patient's Chart, lab work & pertinent test results, reviewed documented beta blocker date and time   Airway Mallampati: II  TM Distance: >3 FB     Dental  (+) Chipped   Pulmonary former smoker,           Cardiovascular      Neuro/Psych    GI/Hepatic   Endo/Other    Renal/GU      Musculoskeletal   Abdominal   Peds  Hematology   Anesthesia Other Findings   Reproductive/Obstetrics                             Anesthesia Physical Anesthesia Plan  ASA: III  Anesthesia Plan: Epidural   Post-op Pain Management:    Induction:   PONV Risk Score and Plan:   Airway Management Planned:   Additional Equipment:   Intra-op Plan:   Post-operative Plan:   Informed Consent: I have reviewed the patients History and Physical, chart, labs and discussed the procedure including the risks, benefits and alternatives for the proposed anesthesia with the patient or authorized representative who has indicated his/her understanding and acceptance.     Plan Discussed with: CRNA  Anesthesia Plan Comments:         Anesthesia Quick Evaluation

## 2017-12-20 NOTE — OB Triage Note (Signed)
Patients presents with ctx beginning at midnight stating they are 7-10 minutes apart. Only states brown/yellow discharge. Positive fetal movement. States bloody discharge and denies sexual intercourse in last 24 hours. Rates pain of a 7/10 in pelvic area that is intermittent. Was a little over 1 cm a week ago.

## 2017-12-20 NOTE — Anesthesia Procedure Notes (Signed)
Epidural Patient location during procedure: OB  Staffing Anesthesiologist: Forrest Demuro, MD Performed: anesthesiologist   Preanesthetic Checklist Completed: patient identified, site marked, surgical consent, pre-op evaluation, timeout performed, IV checked, risks and benefits discussed and monitors and equipment checked  Epidural Patient position: sitting Prep: ChloraPrep Patient monitoring: heart rate, continuous pulse ox and blood pressure Approach: midline Location: L4-L5 Injection technique: LOR saline  Needle:  Needle type: Tuohy  Needle gauge: 18 G Needle length: 9 cm and 9 Catheter type: closed end flexible Catheter size: 20 Guage Test dose: negative and 1.5% lidocaine with Epi 1:200 K  Assessment Sensory level: T10 Events: blood not aspirated, injection not painful, no injection resistance, negative IV test and no paresthesia  Additional Notes   Patient tolerated the insertion well without complications.Reason for block:procedure for pain     

## 2017-12-20 NOTE — Progress Notes (Signed)
Patient ID: Sharon Soto, female   DOB: 19-Jan-1991, 27 y.o.   MRN: [id number]  SVE was unchanged, 4/80/-3 IUPC placed easily without issues.  Contractions were not adequate. Pitocin was ordered to be started. Category II tracing.  NST: 125 bpm baseline, moderate variability, 15x15 accelerations, early decelerations. Tocometer : every 3-4 mintues  Adrian Prows MD [place] 12/20/17 2:16 PM

## 2017-12-20 NOTE — Progress Notes (Signed)
AROM, meconium present 4/80/-3  NST: 125 bpm baseline, moderate variability, 15x15 accelerations, no decelerations. Tocometer : q 1-4 minutes  Continue with expectant management. Patient can have an epidural when desired.  Adrian Prows MD [facility], [facility] 12/20/17 8:42 AM

## 2017-12-20 NOTE — Progress Notes (Signed)
Patient ID: Sharon Soto, female   DOB: [birth year], 27 y.o.   MRN: [id number]  Patient's contractions are currently adequate at more than 200 MVUs every 10 minutes.  Will monitor patient closely for chorioamnionitis. She has had an increase in her baseline to the 140-150s. Temperature is 99.1 Maternal heart rate is 103. She did have 3 blankets on her and a robe. Two blankets and robe removed. She is comfortable. I assisted nursing with moving her to the right side.   NST: 145 bpm baseline, moderate variability, 15x15 accelerations, no decelerations. Tocometer : q 2-3 mintues  Adrian Prows MD [place], [facility] 12/20/17 5:13 PM

## 2017-12-20 NOTE — Progress Notes (Signed)
Patient ID: Sharon Soto, female   DOB: 1990/06/11, 27 y.o.   MRN: [id number]   No cervical change since this morning despite augmentation.   Patient's last temperature was 100.4. She has had a increase in baseline fetal heart rate but no maternal or fetal tachycardia at this time. Chorioamnionitis may be developing.   NST: 150 bpm baseline, moderate variability,  Accelerations present , decelerations with and after the peak of the contractions.  Tocometer : every 2-3 minutes  Will proceed with cesarean section for arrest of dilation, remote from delivery with Category II tracing.   Adrian Prows MD [facility], [facility] 6:53 PM

## 2017-12-20 NOTE — Discharge Summary (Addendum)
OB Discharge Summary     Patient Name: Sharon Soto DOB: May 14, 1990 MRN: [id number]  Date of admission: 12/20/2017 Delivering MD: Homero Fellers, MD  Date of Delivery: 12/20/2017  Date of discharge: 12/23/2017  Admitting diagnosis: 40 wks preg contractions leaking fluid Intrauterine pregnancy: [redacted]w[redacted]d     Secondary diagnosis: Chorioamnionitis     Discharge diagnosis: Term Pregnancy Delivered, Chorioamnionitis and Reasons for cesarean section  Arrest of Dilation                         Hospital course:  Onset of Labor With Unplanned C/S  27 y.o. yo G1P0 at [redacted]w[redacted]d was admitted in Latent Labor on 12/20/2017. Patient had a labor course significant for . Membrane Rupture Time/Date: 8:33 AM ,12/20/2017  . Augmentation of labor with pitocin.  Development of chorioamnionitis after 12 hours of ruptured membranes.  The patient went for cesarean section due to Arrest of Dilation, and delivered a Viable infant,12/20/2017  Details of operation can be found in separate operative note. Patient had an uncomplicated postpartum course.  She is ambulating,tolerating a regular diet, passing flatus, and urinating well.  Patient is discharged home in stable condition 12/23/17.                                                                 Post partum procedures: none  Complications: Intrauterine Inflammation or infection (Chorioamniotis)  Physical exam on 12/23/2017: Vitals:   12/22/17 0837 12/22/17 1622 12/22/17 2328 12/23/17 0754  BP: 111/69 112/74 126/68 134/75  Pulse: 66 74 71 77  Resp: 20 18 18 18   Temp: 98.5 F (36.9 C) 98 F (36.7 C) 98.1 F (36.7 C) (!) 97.2 F (36.2 C)  TempSrc: Oral Oral Oral Oral  SpO2: 100% 99% 100% 100%  Weight:      Height:       General: alert, cooperative and no distress Lochia: appropriate Uterine Fundus: firm Incision: Dressing is clean, dry, and intact DVT Evaluation: No evidence of DVT seen on physical exam.  Labs: Lab Results  Component Value Date    WBC 18.7 (H) 12/21/2017   HGB 10.1 (L) 12/21/2017   HCT 31.7 (L) 12/21/2017   MCV 88.1 12/21/2017   PLT 195 12/21/2017   CMP Latest Ref Rng & Units 04/18/2017  Glucose 65 - 99 mg/dL 87  BUN 6 - 20 mg/dL 10  Creatinine 0.44 - 1.00 mg/dL 0.73  Sodium 135 - 145 mmol/L 136  Potassium 3.5 - 5.1 mmol/L 3.4(L)  Chloride 101 - 111 mmol/L 106  CO2 22 - 32 mmol/L 21(L)  Calcium 8.9 - 10.3 mg/dL 8.9  Total Protein 6.5 - 8.1 g/dL 7.6  Total Bilirubin 0.3 - 1.2 mg/dL 0.5  Alkaline Phos 38 - 126 U/L 57  AST 15 - 41 U/L 19  ALT 14 - 54 U/L 16    Discharge instruction: per After Visit Summary.  Medications:  Allergies as of 12/23/2017   No Known Allergies     Medication List    STOP taking these medications   acetaminophen 500 MG tablet Commonly known as:  TYLENOL     TAKE these medications   fluticasone 50 MCG/ACT nasal spray Commonly known as:  FLONASE Place 1 spray into both nostrils  2 (two) times daily.   ibuprofen 600 MG tablet Commonly known as:  ADVIL,MOTRIN Take 1 tablet (600 mg total) by mouth every 6 (six) hours as needed.   norethindrone 0.35 MG tablet Commonly known as:  MICRONOR,CAMILA,ERRIN Take 1 tablet (0.35 mg total) by mouth daily.   oxyCODONE-acetaminophen 5-325 MG tablet Commonly known as:  PERCOCET/ROXICET Take 1-2 tablets by mouth every 6 (six) hours as needed.   PRENATAL GUMMIES/DHA & FA PO Take by mouth.            Discharge Care Instructions  (From admission, onward)         Start     Ordered   12/23/17 0000  Discharge wound care:    Comments:  You may apply a light dressing for minor discharge from the incision or to keep waistbands of clothing from rubbing.  You may also have been discharge with a clear dressing in which case this will be removed at your postoperative clinic visit.  You may shower, use soap on your incision.  Avoid baths or soaking the incision in the first 6 weeks following your surgery..   12/23/17 1424           Diet: routine diet  Activity: Advance as tolerated. Pelvic rest for 6 weeks.   Outpatient follow up: 1 week for incision check Follow-up Information    Schuman, Christanna R, MD. Schedule an appointment as soon as possible for a visit in 1 week(s).   Specialty:  Obstetrics and Gynecology Why:  please call and make an appointment for 1 week for an Incision check Contact information: [address]. [place] [address] [phone]            Postpartum contraception: Progesterone only pills Rhogam Given postpartum: no Rubella vaccine given postpartum: no Varicella vaccine given postpartum: no TDaP given antepartum or postpartum: Yes  Newborn Data: Live born female  Birth Weight: 7 lb 6.5 oz APGAR: 8, 9  Newborn Delivery   Birth date/time:  12/20/2017 19:45:00 Delivery type:  C-Section, Low Transverse Trial of labor:  Yes C-section categorization:  Primary      Baby Feeding: Breast  Name: [name]  Disposition: Baby in currently NICU, to room in with mother tonight; mother to board in room.  SIGNED: Avel Sensor, CNM 12/23/2017  4:54 PM

## 2017-12-20 NOTE — Anesthesia Post-op Follow-up Note (Signed)
Anesthesia QCDR form completed.        

## 2017-12-20 NOTE — H&P (Signed)
OB History & Physical   History of Present Illness:  Chief Complaint:  Complains of strong regular contractions since midnight HPI:  Sharon Soto is a 27 y.o. G1P0 female with EDC= 12/20/2017 at [redacted]w[redacted]d dated by a 7wk ultrasound.  Her pregnancy has been uncomplicated. There was a right lateral intramural  fibroid measuring 6.87 x 5.38 cm noted on her anatomy scan.She presents to L&D for evaluation of labor. Her contractions on arrival were every 4-5 minutes and cervix has progressed from 3/60% to 4/80% over an hour. Denies leakage of fluid. Baby active. +blood show  Prenatal care site: Prenatal care at [facility] has been remarkable for   Clinic [facility] Prenatal Labs  Dating  7wk 1day  ultrasound Blood type: A/Positive/-- (04/04 0940)   Genetic Screen declines Antibody:Negative (04/04 0940)  Anatomic [place] Normal anatomy. Female. Posterior placenta Rubella: 3.89 (04/04 0940) Varicella: Immune  GTT   Third trimester: 90s RPR: Non Reactive (04/04 0940)   Rhogam  not needed HBsAg: Negative (04/04 0940)   TDaP vaccine      10/27/2017                  Flu Shot: 10/27/17 HIV: Non Reactive (04/04 0940)   Baby Food           breast                     GBS:  negative  Contraception Nexplanon vs pill ? Pap: 04/23/2017, NILM  CBB     CS/VBAC NA   Support Person Alvester Chou         Maternal Medical History:   Past Medical History:  Diagnosis Date  . Condyloma acuminata   . Uterine fibroid during pregnancy, antepartum     Past Surgical History:  Procedure Laterality Date  . NO PAST SURGERIES      No Known Allergies  Prior to Admission medications   Medication Sig Start Date End Date Taking? Authorizing Provider  Prenatal MV-Min-FA-Omega-3 (PRENATAL GUMMIES/DHA & FA PO) Take by mouth.   Yes [provider]  acetaminophen (TYLENOL) 500 MG tablet Take 2 tablets (1,000 mg total) by mouth every 6 (six) hours as needed for mild pain, moderate pain or headache. Patient not taking: Reported  on 07/02/2017 05/30/15   Olen Cordial, NP  fluticasone (FLONASE) 50 MCG/ACT nasal spray Place 1 spray into both nostrils 2 (two) times daily. Patient not taking: Reported on 07/02/2017 05/30/15   Olen Cordial, NP          Social History: She  reports that she has quit smoking. She has a 0.25 pack-year smoking history. She has never used smokeless tobacco. She reports that she drinks alcohol. She reports that she does not use drugs.  Family History: family history is not on file.   Review of Systems: Negative x 10 systems reviewed except as noted in the HPI.      Physical Exam:  Vital Signs: BP 134/85 (BP Location: Right Arm)   Pulse 80   Temp 98.4 F (36.9 C) (Oral)   Resp 17   Ht 5\' 1"  (1.549 m)   Wt 71.7 kg   LMP 03/02/2017   BMI 29.85 kg/m  General: no acute distress.  HEENT: normocephalic, atraumatic Heart: regular rate & rhythm.  Grade II-III/VI systolic murmur best heard in pulmonic area Lungs: clear to auscultation bilaterally Abdomen: soft, gravid, non-tender;  EFW: 7# Pelvic:   External: Normal external female genitalia  Cervix: Dilation:  4 / Effacement (%): 80 / Station: -1   Extremities: non-tender, symmetric, +1 nonpitting edema bilaterally.  DTRs: +1 to +2  Neurologic: Alert & oriented x 3.   Baseline FHR: 125-130 with accelerations to 140s to 150, moderate variability Toco: contractions every 3-4 min apart Bedside Ultrasound:  Presentation: LOA    Assessment:  Sharon Soto is a 27 y.o. G1P0 female at [redacted]w[redacted]d in early labor FWB: Cat 1 tracing Plan:  1. Admit to Labor & Delivery -anticipate vaginal delivery 2. CBC, T&S, Clrs, IVF 3. GBS negative.   4. Consents obtained. 5. Monitor progress and fetal-maternal well being. 6. A POS/ RI/ VI 7. TDAP & flu vaccine UTD 10/27/2017 8. Nexplanon vs pill 9. Breast   Dalia Heading  12/20/2017 6:29 AM

## 2017-12-20 NOTE — Plan of Care (Signed)
  Problem: Education: Goal: Knowledge of Childbirth will improve Outcome: Progressing Goal: Ability to make informed decisions regarding treatment and plan of care will improve Outcome: Progressing Goal: Ability to state and carry out methods to decrease the pain will improve Outcome: Progressing   Problem: Coping: Goal: Ability to verbalize concerns and feelings about labor and delivery will improve Outcome: Progressing   Problem: Life Cycle: Goal: Ability to make normal progression through stages of labor will improve Outcome: Progressing Goal: Ability to effectively push during vaginal delivery will improve Outcome: Progressing   Problem: Role Relationship: Goal: Ability to demonstrate positive interaction with the child will improve Outcome: Progressing   Problem: Safety: Goal: Risk of complications during labor and delivery will decrease Outcome: Progressing   Problem: Pain Management: Goal: Relief or control of pain from uterine contractions will improve Outcome: Progressing   

## 2017-12-20 NOTE — Discharge Instructions (Signed)
Breastfeeding Choosing to breastfeed is one of the best decisions you can make for yourself and your baby. A change in hormones during pregnancy causes your breasts to make breast milk in your milk-producing glands. Hormones prevent breast milk from being released before your baby is born. They also prompt milk flow after birth. Once breastfeeding has begun, thoughts of your baby, as well as his or her sucking or crying, can stimulate the release of milk from your milk-producing glands. Benefits of breastfeeding Research shows that breastfeeding offers many health benefits for infants and mothers. It also offers a cost-free and convenient way to feed your baby. For your baby  Your first milk (colostrum) helps your baby's digestive system to function better.  Special cells in your milk (antibodies) help your baby to fight off infections.  Breastfed babies are less likely to develop asthma, allergies, obesity, or type 2 diabetes. They are also at lower risk for sudden infant death syndrome (SIDS).  Nutrients in breast milk are better able to meet your babys needs compared to infant formula.  Breast milk improves your baby's brain development. For you  Breastfeeding helps to create a very special bond between you and your baby.  Breastfeeding is convenient. Breast milk costs nothing and is always available at the correct temperature.  Breastfeeding helps to burn calories. It helps you to lose the weight that you gained during pregnancy.  Breastfeeding makes your uterus return faster to its size before pregnancy. It also slows bleeding (lochia) after you give birth.  Breastfeeding helps to lower your risk of developing type 2 diabetes, osteoporosis, rheumatoid arthritis, cardiovascular disease, and breast, ovarian, uterine, and endometrial cancer later in life. Breastfeeding basics Starting breastfeeding  Find a comfortable place to sit or lie down, with your neck and back  well-supported.  Place a pillow or a rolled-up blanket under your baby to bring him or her to the level of your breast (if you are seated). Nursing pillows are specially designed to help support your arms and your baby while you breastfeed.  Make sure that your baby's tummy (abdomen) is facing your abdomen.  Gently massage your breast. With your fingertips, massage from the outer edges of your breast inward toward the nipple. This encourages milk flow. If your milk flows slowly, you may need to continue this action during the feeding.  Support your breast with 4 fingers underneath and your thumb above your nipple (make the letter "C" with your hand). Make sure your fingers are well away from your nipple and your babys mouth.  Stroke your baby's lips gently with your finger or nipple.  When your baby's mouth is open wide enough, quickly bring your baby to your breast, placing your entire nipple and as much of the areola as possible into your baby's mouth. The areola is the colored area around your nipple. ? More areola should be visible above your baby's upper lip than below the lower lip. ? Your baby's lips should be opened and extended outward (flanged) to ensure an adequate, comfortable latch. ? Your baby's tongue should be between his or her lower gum and your breast.  Make sure that your baby's mouth is correctly positioned around your nipple (latched). Your baby's lips should create a seal on your breast and be turned out (everted).  It is common for your baby to suck about 2-3 minutes in order to start the flow of breast milk. Latching Teaching your baby how to latch onto your breast properly is very  important. An improper latch can cause nipple pain, decreased milk supply, and poor weight gain in your baby. Also, if your baby is not latched onto your nipple properly, he or she may swallow some air during feeding. This can make your baby fussy. Burping your baby when you switch breasts  during the feeding can help to get rid of the air. However, teaching your baby to latch on properly is still the best way to prevent fussiness from swallowing air while breastfeeding. Signs that your baby has successfully latched onto your nipple  Silent tugging or silent sucking, without causing you pain. Infant's lips should be extended outward (flanged).  Swallowing heard between every 3-4 sucks once your milk has started to flow (after your let-down milk reflex occurs).  Muscle movement above and in front of his or her ears while sucking.  Signs that your baby has not successfully latched onto your nipple  Sucking sounds or smacking sounds from your baby while breastfeeding.  Nipple pain.  If you think your baby has not latched on correctly, slip your finger into the corner of your babys mouth to break the suction and place it between your baby's gums. Attempt to start breastfeeding again. Signs of successful breastfeeding Signs from your baby  Your baby will gradually decrease the number of sucks or will completely stop sucking.  Your baby will fall asleep.  Your baby's body will relax.  Your baby will retain a small amount of milk in his or her mouth.  Your baby will let go of your breast by himself or herself.  Signs from you  Breasts that have increased in firmness, weight, and size 1-3 hours after feeding.  Breasts that are softer immediately after breastfeeding.  Increased milk volume, as well as a change in milk consistency and color by the fifth day of breastfeeding.  Nipples that are not sore, cracked, or bleeding.  Signs that your baby is getting enough milk  Wetting at least 1-2 diapers during the first 24 hours after birth.  Wetting at least 5-6 diapers every 24 hours for the first week after birth. The urine should be clear or pale yellow by the age of 5 days.  Wetting 6-8 diapers every 24 hours as your baby continues to grow and develop.  At least 3  stools in a 24-hour period by the age of 5 days. The stool should be soft and yellow.  At least 3 stools in a 24-hour period by the age of 7 days. The stool should be seedy and yellow.  No loss of weight greater than 10% of birth weight during the first 3 days of life.  Average weight gain of 4-7 oz (113-198 g) per week after the age of 4 days.  Consistent daily weight gain by the age of 5 days, without weight loss after the age of 2 weeks. After a feeding, your baby may spit up a small amount of milk. This is normal. Breastfeeding frequency and duration Frequent feeding will help you make more milk and can prevent sore nipples and extremely full breasts (breast engorgement). Breastfeed when you feel the need to reduce the fullness of your breasts or when your baby shows signs of hunger. This is called "breastfeeding on demand." Signs that your baby is hungry include:  Increased alertness, activity, or restlessness.  Movement of the head from side to side.  Opening of the mouth when the corner of the mouth or cheek is stroked (rooting).  Increased sucking sounds,  smacking lips, cooing, sighing, or squeaking.  Hand-to-mouth movements and sucking on fingers or hands.  Fussing or crying.  Avoid introducing a pacifier to your baby in the first 4-6 weeks after your baby is born. After this time, you may choose to use a pacifier. Research has shown that pacifier use during the first year of a baby's life decreases the risk of sudden infant death syndrome (SIDS). Allow your baby to feed on each breast as long as he or she wants. When your baby unlatches or falls asleep while feeding from the first breast, offer the second breast. Because newborns are often sleepy in the first few weeks of life, you may need to awaken your baby to get him or her to feed. Breastfeeding times will vary from baby to baby. However, the following rules can serve as a guide to help you make sure that your baby is  properly fed:  Newborns (babies 53 weeks of age or younger) may breastfeed every 1-3 hours.  Newborns should not go without breastfeeding for longer than 3 hours during the day or 5 hours during the night.  You should breastfeed your baby a minimum of 8 times in a 24-hour period.  Breast milk pumping Pumping and storing breast milk allows you to make sure that your baby is exclusively fed your breast milk, even at times when you are unable to breastfeed. This is especially important if you go back to work while you are still breastfeeding, or if you are not able to be present during feedings. Your lactation consultant can help you find a method of pumping that works best for you and give you guidelines about how long it is safe to store breast milk. Caring for your breasts while you breastfeed Nipples can become dry, cracked, and sore while breastfeeding. The following recommendations can help keep your breasts moisturized and healthy:  Avoid using soap on your nipples.  Wear a supportive bra designed especially for nursing. Avoid wearing underwire-style bras or extremely tight bras (sports bras).  Air-dry your nipples for 3-4 minutes after each feeding.  Use only cotton bra pads to absorb leaked breast milk. Leaking of breast milk between feedings is normal.  Use lanolin on your nipples after breastfeeding. Lanolin helps to maintain your skin's normal moisture barrier. Pure lanolin is not harmful (not toxic) to your baby. You may also hand express a few drops of breast milk and gently massage that milk into your nipples and allow the milk to air-dry.  In the first few weeks after giving birth, some women experience breast engorgement. Engorgement can make your breasts feel heavy, warm, and tender to the touch. Engorgement peaks within 3-5 days after you give birth. The following recommendations can help to ease engorgement:  Completely empty your breasts while breastfeeding or pumping. You  may want to start by applying warm, moist heat (in the shower or with warm, water-soaked hand towels) just before feeding or pumping. This increases circulation and helps the milk flow. If your baby does not completely empty your breasts while breastfeeding, pump any extra milk after he or she is finished.  Apply ice packs to your breasts immediately after breastfeeding or pumping, unless this is too uncomfortable for you. To do this: ? Put ice in a plastic bag. ? Place a towel between your skin and the bag. ? Leave the ice on for 20 minutes, 2-3 times a day.  Make sure that your baby is latched on and positioned properly while  breastfeeding.  If engorgement persists after 48 hours of following these recommendations, contact your health care provider or a Science writer. Overall health care recommendations while breastfeeding  Eat 3 healthy meals and 3 snacks every day. Well-nourished mothers who are breastfeeding need an additional 450-500 calories a day. You can meet this requirement by increasing the amount of a balanced diet that you eat.  Drink enough water to keep your urine pale yellow or clear.  Rest often, relax, and continue to take your prenatal vitamins to prevent fatigue, stress, and low vitamin and mineral levels in your body (nutrient deficiencies).  Do not use any products that contain nicotine or tobacco, such as cigarettes and e-cigarettes. Your baby may be harmed by chemicals from cigarettes that pass into breast milk and exposure to secondhand smoke. If you need help quitting, ask your health care provider.  Avoid alcohol.  Do not use illegal drugs or marijuana.  Talk with your health care provider before taking any medicines. These include over-the-counter and prescription medicines as well as vitamins and herbal supplements. Some medicines that may be harmful to your baby can pass through breast milk.  It is possible to become pregnant while breastfeeding. If  birth control is desired, ask your health care provider about options that will be safe while breastfeeding your baby. Where to find more information: [employer]: www.llli.org Contact a health care provider if:  You feel like you want to stop breastfeeding or have become frustrated with breastfeeding.  Your nipples are cracked or bleeding.  Your breasts are red, tender, or warm.  You have: ? Painful breasts or nipples. ? A swollen area on either breast. ? A fever or chills. ? Nausea or vomiting. ? Drainage other than breast milk from your nipples.  Your breasts do not become full before feedings by the fifth day after you give birth.  You feel sad and depressed.  Your baby is: ? Too sleepy to eat well. ? Having trouble sleeping. ? More than 80 week old and wetting fewer than 6 diapers in a 24-hour period. ? Not gaining weight by 82 days of age.  Your baby has fewer than 3 stools in a 24-hour period.  Your baby's skin or the white parts of his or her eyes become yellow. Get help right away if:  Your baby is overly tired (lethargic) and does not want to wake up and feed.  Your baby develops an unexplained fever. Summary  Breastfeeding offers many health benefits for infant and mothers.  Try to breastfeed your infant when he or she shows early signs of hunger.  Gently tickle or stroke your baby's lips with your finger or nipple to allow the baby to open his or her mouth. Bring the baby to your breast. Make sure that much of the areola is in your baby's mouth. Offer one side and burp the baby before you offer the other side.  Talk with your health care provider or lactation consultant if you have questions or you face problems as you breastfeed. This information is not intended to replace advice given to you by your health care provider. Make sure you discuss any questions you have with your health care provider. Document Released: 01/06/2005 Document  Revised: 02/08/2016 Document Reviewed: 02/08/2016 Elsevier Interactive Patient Education  2018 [employer] Tips If you are breastfeeding, there may be times when you cannot feed your baby directly. Returning to work or going on a trip are common examples.  Pumping allows you to store breast milk and feed it to your baby later. You may not get much milk when you first start to pump. Your breasts should start to make more after a few days. If you pump at the times you usually feed your baby, you may be able to keep making enough milk to feed your baby without also using formula. The more often you pump, the more milk you will produce. When should I pump?  You can begin to pump soon after delivery. However, some experts recommend waiting about 4 weeks before giving your infant a bottle to make sure breastfeeding is going well.  If you plan to return to work, begin pumping a few weeks before. This will help you develop techniques that work best for you. It also lets you build up a supply of breast milk.  When you are with your infant, feed on demand and pump after each feeding.  When you are away from your infant for several hours, pump for about 15 minutes every 2-3 hours. Pump both breasts at the same time if you can.  If your infant has a formula feeding, make sure to pump around the same time.  If you drink any alcohol, wait 2 hours before pumping. How do I prepare to pump? Your let-down reflexis the natural reaction to stimulation that makes your breast milk flow. It is easier to stimulate this reflex when you are relaxed. Find relaxation techniques that work for you. If you have difficulty with your let-down reflex, try these methods:  Smell one of your infant's blankets or an item of clothing.  Look at a picture or video of your infant.  Sit in a quiet, private space.  Massage the breast you plan to pump.  Place soothing warmth on the breast.  Play relaxing  music.  What are some general breast pumping tips?  Wash your hands before you pump. You do not need to wash your nipples or breasts.  There are three ways to pump. ? You can use your hand to massage and compress your breast. ? You can use a handheld manual pump. ? You can use an electric pump.  Make sure the suction cup (flange) on the breast pump is the right size. Place the flange directly over the nipple. If it is the wrong size or placed the wrong way, it may be painful and cause nipple damage.  If pumping is uncomfortable, apply a small amount of purified or modified lanolin to your nipple and areola.  If you are using an electric pump, adjust the speed and suction power to be more comfortable.  If pumping is painful or if you are not getting very much milk, you may need a different type of pump. A lactation consultant can help you determine what type of pump to use.  Keep a full water bottle near you at all times. Drinking lots of fluid helps you make more milk.  You can store your milk to use later. Pumped breast milk can be stored in a sealable, sterile container or plastic bag. Label all stored breast milk with the date you pumped it. ? Milk can stay out at room temperature for up to 8 hours. ? You can store your milk in the refrigerator for up to 8 days. ? You can store your milk in the freezer for 3 months. Thaw frozen milk using warm water. Do not put it in the microwave.  Do not smoke. Smoking can lower  your milk supply and harm your infant. If you need help quitting, ask your health care provider to recommend a program. When should I call my health care provider or a lactation consultant?  You are having trouble pumping.  You are concerned that you are not making enough milk.  You have nipple pain, soreness, or redness.  You want to use birth control. Birth control pills may lower your milk supply. Talk to your health care provider about your options. This  information is not intended to replace advice given to you by your health care provider. Make sure you discuss any questions you have with your health care provider. Document Released: 06/26/2009 Document Revised: 06/20/2015 Document Reviewed: 10/29/2012 Elsevier Interactive Patient Education  2017 [employer] Cesarean Delivery, Care After Refer to this sheet in the next few weeks. These instructions provide you with information about caring for yourself after your procedure. Your health care provider may also give you more specific instructions. Your treatment has been planned according to current medical practices, but problems sometimes occur. Call your health care provider if you have any problems or questions after your procedure. What can I expect after the procedure? After the procedure, it is common to have:  A small amount of blood or clear fluid coming from the incision.  Some redness, swelling, and pain in your incision area.  Some abdominal pain and soreness.  Vaginal bleeding (lochia).  Pelvic cramps.  Fatigue.  Follow these instructions at home: Incision care   Follow instructions from your health care provider about how to take care of your incision. Make sure you: ? Wash your hands with soap and water before you change your bandage (dressing). If soap and water are not available, use hand sanitizer. ? If you have a dressing, change it as told by your health care provider. ? Leave stitches (sutures), skin staples, skin glue, or adhesive strips in place. These skin closures may need to stay in place for 2 weeks or longer. If adhesive strip edges start to loosen and curl up, you may trim the loose edges. Do not remove adhesive strips completely unless your health care provider tells you to do that.  Check your incision area every day for signs of infection. Check for: ? More redness, swelling, or pain. ? More fluid or blood. ? Warmth. ? Pus or a bad smell.  When you  cough or sneeze, hug a pillow. This helps with pain and decreases the chance of your incision opening up (dehiscing). Do this until your incision heals. Medicines  Take over-the-counter and prescription medicines only as told by your health care provider.  If you were prescribed an antibiotic medicine, take it as told by your health care provider. Do not stop taking the antibiotic until it is finished. Driving  Do not drive or operate heavy machinery while taking prescription pain medicine. Lifestyle  Do not drink alcohol. This is especially important if you are breastfeeding or taking pain medicine.  Do not use tobacco products, including cigarettes, chewing tobacco, or e-cigarettes. If you need help quitting, ask your health care provider. Tobacco can delay wound healing. Eating and drinking  Drink at least 8 eight-ounce glasses of water every day unless told not to by your health care provider. If you breastfeed, you may need to drink more water than this.  Eat high-fiber foods every day. These foods may help prevent or relieve constipation. High-fiber foods include: ? Whole grain cereals and breads. ? Brown rice. ? Beans. ?  Fresh fruits and vegetables. Activity  Return to your normal activities as told by your health care provider. Ask your health care provider what activities are safe for you.  Rest as much as possible. Try to rest or take a nap while your baby is sleeping.  Do not lift anything that is heavier than your baby or 10 lb (4.5 kg) as told by your health care provider.  Ask your health care provider when you can engage in sexual activity. This may depend on your: ? Risk of infection. ? Healing rate. ? Comfort and desire to engage in sexual activity. Bathing  Do not take baths, swim, or use a hot tub until your health care provider approves. Ask your health care provider if you can take showers. You may only be allowed to take sponge baths until your incision  heals. General instructions  Do not use tampons or douches until your health care provider approves.  Wear: ? Loose, comfortable clothing. ? A supportive and well-fitting bra.  Watch for any blood clots that may pass from your vagina. These may look like clumps of dark red, brown, or black discharge.  Keep your perineum clean and dry as told by your health care provider.  Wipe from front to back when you use the toilet.  If possible, have someone help you care for your baby and help with household activities for a few days after you leave the hospital.  Keep all follow-up visits for you and your baby as told by your health care provider. This is important. Contact a health care provider if:  You have: ? Bad-smelling vaginal discharge. ? Difficulty urinating. ? Pain when urinating. ? A sudden increase or decrease in the frequency of your bowel movements. ? More redness, swelling, or pain around your incision. ? More fluid or blood coming from your incision. ? Pus or a bad smell coming from your incision. ? A fever. ? A rash. ? Little or no interest in activities you used to enjoy. ? Questions about caring for yourself or your baby. ? Nausea.  Your incision feels warm to the touch.  Your breasts turn red or become painful or hard.  You feel unusually sad or worried.  You vomit.  You pass large blood clots from your vagina. If you pass a blood clot, save it to show to your health care provider. Do not flush blood clots down the toilet without showing your health care provider.  You urinate more than usual.  You are dizzy or light-headed.  You have not breastfed and have not had a menstrual period for 12 weeks after delivery.  You stopped breastfeeding and have not had a menstrual period for 12 weeks after stopping breastfeeding. Get help right away if:  You have: ? Pain that does not go away or get better with medicine. ? Chest pain. ? Difficulty  breathing. ? Blurred vision or spots in your vision. ? Thoughts about hurting yourself or your baby. ? New pain in your abdomen or in one of your legs. ? A severe headache.  You faint.  You bleed from your vagina so much that you fill two sanitary pads in one hour. This information is not intended to replace advice given to you by your health care provider. Make sure you discuss any questions you have with your health care provider. Document Released: 09/28/2001 Document Revised: 02/09/2016 Document Reviewed: 12/11/2014 Elsevier Interactive Patient Education  [employer].

## 2017-12-20 NOTE — Transfer of Care (Signed)
Immediate Anesthesia Transfer of Care Note  Patient: Sharon Soto  Procedure(s) Performed: CESAREAN SECTION (N/A Abdomen)  Patient Location: PACU  Anesthesia Type:Epidural  Level of Consciousness: awake, alert  and oriented  Airway & Oxygen Therapy: Patient Spontanous Breathing  Post-op Assessment: Report given to RN and Post -op Vital signs reviewed and stable  Post vital signs: Reviewed and stable  Last Vitals:  Vitals Value Taken Time  BP 127/65 12/20/2017  8:59 PM  Temp 38.3 C 12/20/2017  8:59 PM  Pulse 80 12/20/2017  8:59 PM  Resp 16 12/20/2017  8:59 PM  SpO2 100 % 12/20/2017  8:59 PM    Last Pain:  Vitals:   12/20/17 2059  TempSrc: Oral  PainSc:          Complications: No apparent anesthesia complications

## 2017-12-20 NOTE — Anesthesia Procedure Notes (Signed)
Date/Time: 12/20/2017 7:18 PM Performed by: Johnna Acosta, CRNA Pre-anesthesia Checklist: Patient identified, Emergency Drugs available, Suction available, Patient being monitored and Timeout performed Patient Re-evaluated:Patient Re-evaluated prior to induction Oxygen Delivery Method: Simple face mask Preoxygenation: Pre-oxygenation with 100% oxygen

## 2017-12-21 ENCOUNTER — Encounter: Payer: BLUE CROSS/BLUE SHIELD | Admitting: Obstetrics and Gynecology

## 2017-12-21 ENCOUNTER — Encounter: Payer: Self-pay | Admitting: Obstetrics and Gynecology

## 2017-12-21 LAB — CBC
HCT: 31.1 % — ABNORMAL LOW (ref 36.0–46.0)
HCT: 31.7 % — ABNORMAL LOW (ref 36.0–46.0)
Hemoglobin: 10 g/dL — ABNORMAL LOW (ref 12.0–15.0)
Hemoglobin: 10.1 g/dL — ABNORMAL LOW (ref 12.0–15.0)
MCH: 27.7 pg (ref 26.0–34.0)
MCH: 28.1 pg (ref 26.0–34.0)
MCHC: 32.2 g/dL (ref 30.0–36.0)
MCHC: 31.9 g/dL (ref 30.0–36.0)
MCV: 86.1 fL (ref 80.0–100.0)
MCV: 88.1 fL (ref 80.0–100.0)
NRBC: 0 % (ref 0.0–0.2)
Platelets: 181 10*3/uL (ref 150–400)
Platelets: 195 10*3/uL (ref 150–400)
RBC: 3.61 MIL/uL — ABNORMAL LOW (ref 3.87–5.11)
RBC: 3.6 MIL/uL — ABNORMAL LOW (ref 3.87–5.11)
RDW: 15.8 % — ABNORMAL HIGH (ref 11.5–15.5)
RDW: 16 % — ABNORMAL HIGH (ref 11.5–15.5)
WBC: 19.6 10*3/uL — ABNORMAL HIGH (ref 4.0–10.5)
WBC: 18.7 10*3/uL — ABNORMAL HIGH (ref 4.0–10.5)
nRBC: 0 % (ref 0.0–0.2)

## 2017-12-21 NOTE — Anesthesia Postprocedure Evaluation (Signed)
Anesthesia Post Note  Patient: Sharon Soto  Procedure(s) Performed: CESAREAN SECTION (N/A Abdomen)  Patient location during evaluation: Mother Baby Anesthesia Type: Epidural Level of consciousness: awake and alert Pain management: pain level controlled Vital Signs Assessment: post-procedure vital signs reviewed and stable Respiratory status: spontaneous breathing, nonlabored ventilation and respiratory function stable Cardiovascular status: stable Postop Assessment: no headache, no backache and epidural receding Anesthetic complications: no     Last Vitals:  Vitals:   12/21/17 0117 12/21/17 0400  BP: 124/72 120/69  Pulse: 70 69  Resp: 20 18  Temp: 36.7 C 36.6 C  SpO2: 97% 99%    Last Pain:  Vitals:   12/21/17 0825  TempSrc:   PainSc: 5                  Sharon Soto,  [name]

## 2017-12-21 NOTE — Lactation Note (Signed)
This note was copied from a baby's chart. Lactation Consultation Note  Patient Name: Sharon Soto [id number] Date: 12/21/2017 Reason for consult: Follow-up assessment;Term;Other (Comment);NICU baby(Sharon Soto)  [name] was skin to skin with mom in SCN and sucking vigorously on pacifier.  Hand expressed lots of colostrum easily.  Pacifier removed and guided him to the breast as he rooted with wide open mouth.  Strong rhythmic sucking was noted with swallows for 10 minutes before he came off on his own satiated. Maternal Data Formula Feeding for Exclusion: No Has patient been taught Hand Expression?: Yes(Can still hand express lots of colostrum) Does the patient have breastfeeding experience prior to this delivery?: No  Feeding Feeding Type: Breast Fed(mother recently pumped) Nipple Type: Slow - flow  LATCH Score Latch: Repeated attempts needed to sustain latch, nipple held in mouth throughout feeding, stimulation needed to elicit sucking reflex.  Audible Swallowing: A few with stimulation  Type of Nipple: Everted at rest and after stimulation  Comfort (Breast/Nipple): Soft / non-tender  Hold (Positioning): Assistance needed to correctly position infant at breast and maintain latch.  LATCH Score: 7  Interventions Interventions: Pre-pump if needed;Position options;Assisted with latch;Reverse pressure;Expressed milk;DEBP;Skin to skin;Breast compression;Breast massage;Adjust position;Support pillows  Lactation Tools Discussed/Used Tools: Pump Breast pump type: Double-Electric Breast Pump WIC Program: No(BCBS) Pump Review: Setup, frequency, and cleaning;Milk Storage;Other (comment) Initiated by:: S.Channie Bostick,RN,BSN,IBCLC Date initiated:: 12/21/17   Consult Status Consult Status: Follow-up Follow-up type: Call as needed    Jarold Motto 12/21/2017, 7:14 PM

## 2017-12-21 NOTE — Lactation Note (Signed)
This note was copied from a baby's chart. Lactation Consultation Note  Patient Name: Sharon Soto [id number] Date: 12/21/2017 Reason for consult: Initial assessment;Mother's request;Primapara;NICU baby;Term;Other (Comment)(Pumping mom d/t [name] in SCN tachypnea and hypothermia) [name] in SCN d/t Tachypnea and hypothermia.  Assisted mom with hand expressing and initiating pumping with [employer].  Instructed in pumping, collection, storage, labeling, cleaning pump pieces and handling of colostrum.  Mom expressed 7 ml which was taken to nursery for [name].  Discussed supply and demand and need for pumping around every 3 hours to bring in mature milk and ensure a plentiful milk supply.  Discussed normal course of lactation and routine newborn feeding patterns.  Mom reports breast feeding already with good latch and strong tug at the breast and was able to put [name] skin to skin after delivering.  Discussed possibility of putting him to the breast later today if tachypnea resolved.  Mom wants to breast feed again as soon as she can.  Mom has not contacted [employer] about getting DEBP yet.  Explained process of getting DEBP through insurance company.  Lactation name and number written on white board and encouraged to call with any questions, concerns or assistance.    Maternal Data Formula Feeding for Exclusion: No Has patient been taught Hand Expression?: Yes(Can easily hand express colostrum) Does the patient have breastfeeding experience prior to this delivery?: No  Feeding Feeding Type: Other (comment)(NPO d/t tachypnea in SCN)  LATCH Score                   Interventions Interventions: Breast feeding basics reviewed;Expressed milk;DEBP  Lactation Tools Discussed/Used Tools: Pump Breast pump type: Double-Electric Breast Pump WIC Program: No(Mom has BCBS - May change to [employer]) Pump Review: Setup, frequency, and cleaning;Milk Storage;Other (comment) Initiated by::  S.Roselynn Whitacre,RN,BSN,IBCLC Date initiated:: 12/21/17   Consult Status Consult Status: Follow-up Date: 12/21/17 Follow-up type: Call as needed    Jarold Motto 12/21/2017, 11:29 AM

## 2017-12-21 NOTE — Plan of Care (Signed)
Transferred to room 337. Alert and oriented with aprop. Affect. Color good, skin w&d. BBS clear. Lower abdominal dressing d&I. Fundus is firm at U/E and scant amount of Lochia. Oriented to Room, Falls prevention and Moderate Fall Risk, V/O.

## 2017-12-21 NOTE — Progress Notes (Addendum)
  Subjective:   Post Op Day 1: Patient is doing well and states her pain is well controlled with PO pain medication and On Q pump. She is tolerating PO intake. She is ambulating and voiding without difficulty. Breastfeeding is going well per her report.  Objective:  Blood pressure 121/85, pulse 60, temperature 97.8 F (36.6 C), temperature source Oral, resp. rate 20, height 5\' 1"  (1.549 m), weight 71.7 kg, last menstrual period 03/02/2017, SpO2 100 %.  General: NAD Pulmonary: no increased work of breathing Abdomen: non-distended, non-tender, fundus firm at level of umbilicus Incision: Dressing is C/D/I, On Q pump intact Extremities: no edema, no erythema, no tenderness  Results for orders placed or performed during the hospital encounter of 12/20/17 (from the past 24 hour(s))  CBC     Status: Abnormal   Collection Time: 12/21/17  5:05 AM  Result Value Ref Range   WBC 19.6 (H) 4.0 - 10.5 K/uL   RBC 3.61 (L) 3.87 - 5.11 MIL/uL   Hemoglobin 10.0 (L) 12.0 - 15.0 g/dL   HCT 31.1 (L) 36.0 - 46.0 %   MCV 86.1 80.0 - 100.0 fL   MCH 27.7 26.0 - 34.0 pg   MCHC 32.2 30.0 - 36.0 g/dL   RDW 15.8 (H) 11.5 - 15.5 %   Platelets 181 150 - 400 K/uL   nRBC 0.0 0.0 - 0.2 %    Intake/Output Summary (Last 24 hours) at 12/21/2017 1048 Last data filed at 12/21/2017 1000 Gross per 24 hour  Intake 2231.5 ml  Output 3633 ml  Net -1401.5 ml      Assessment:   27 y.o. G1P0 postoperativeday # 1   Plan:  1) Acute blood loss anemia - hemodynamically stable and asymptomatic - po ferrous sulfate  2) CBC repeat at 5 pm today. Antibiotics as needed for continued elevated WBC. Patient is currently afebrile.  2) A positive, Rubella Immune, Varicella Immune  3) TDAP status: given antepartum  4) Breastfeeding/Contraception: plans progesterone-only pill  5) Disposition: continue post c/section care   Rod Can, CNM

## 2017-12-22 LAB — RPR: RPR Ser Ql: NONREACTIVE

## 2017-12-22 MED ORDER — FERROUS SULFATE 325 (65 FE) MG PO TABS
325.0000 mg | ORAL_TABLET | Freq: Every day | ORAL | Status: DC
  Administered 2017-12-22 – 2017-12-23 (×2): 325 mg via ORAL
  Filled 2017-12-22 (×2): qty 1

## 2017-12-23 MED ORDER — NORETHINDRONE 0.35 MG PO TABS: 1.0000 | ORAL_TABLET | Freq: Every day | ORAL | 11 refills | Status: DC

## 2017-12-23 MED ORDER — OXYCODONE-ACETAMINOPHEN 5-325 MG PO TABS: 1.0000 | ORAL_TABLET | Freq: Four times a day (QID) | ORAL | 0 refills | Status: DC | PRN

## 2017-12-23 MED ORDER — IBUPROFEN 600 MG PO TABS: 600.0000 mg | ORAL_TABLET | Freq: Four times a day (QID) | ORAL | 0 refills | Status: DC | PRN

## 2017-12-23 NOTE — Lactation Note (Signed)
This note was copied from a baby's chart. Lactation Consultation Note  Patient Name: Sharon Soto [id number] Date: 12/23/2017   I was called to SCN by mom to help with feed earlier today. [name] was sleepy and not rooting, so we focused on how to position him at breast in football hold to see if that would help SNS use improve. Since he is used to large volumes and fast flow (mom only pumping <5-10 ml), sns seems like prudent tool to try at breast to improve nursing skills while getting volume/calories needed. It did work well yesterday when we tried it. This time, he was quite sleepy and not cuing, so I had Mom practice hand expression of her breast milk as she was discouraged by low output. I immediately got a stream of colostrum out by HE. She said it was more than her pump was getting. As she practiced, she got better.   PLAN:  *Until her milk volume increases, try sns when breastfeeding until no longer needed.  *If poor-no breastfeeds, hand express pump 15 minutes.  *Feedings and "breast sessions" BF or pump) should occur at least 8 times per 24 hours.   Mom to room in with baby tonight and go home tomorrow if all is well by then. LC to F/U in am     Maternal Data    Feeding    LATCH Score                   Interventions    Lactation Tools Discussed/Used     Consult Status      Sharon Soto 12/23/2017, 3:55 PM

## 2017-12-23 NOTE — Progress Notes (Signed)
Patient discharged home. Discharge instructions, prescriptions and follow up appointment given to and reviewed with patient. Patient verbalized understanding. RN laurie Janit Bern went over DC with patient. Patient rooming in with SCN infant tonight.

## 2017-12-25 ENCOUNTER — Telehealth: Payer: Self-pay

## 2017-12-25 NOTE — Telephone Encounter (Signed)
Pt calling to see how to take out the [place].  States it is out of medicine.  [phone]

## 2017-12-25 NOTE — Telephone Encounter (Signed)
Pt is coming to get the ball taken out by Dr.Schuman

## 2017-12-29 ENCOUNTER — Telehealth: Payer: Self-pay

## 2017-12-29 NOTE — Telephone Encounter (Signed)
FMLA/DISABILITY form for [facility] for Pt's mom, Sharon Soto, filled out, signature obtained, and given to [place] for processing.

## 2018-01-01 ENCOUNTER — Encounter: Payer: Self-pay | Admitting: Obstetrics and Gynecology

## 2018-01-01 ENCOUNTER — Ambulatory Visit (INDEPENDENT_AMBULATORY_CARE_PROVIDER_SITE_OTHER): Payer: BLUE CROSS/BLUE SHIELD | Admitting: Obstetrics and Gynecology

## 2018-01-01 NOTE — Progress Notes (Signed)
  OBSTETRICS POSTPARTUM CLINIC PROGRESS NOTE  Subjective:     Sharon Soto is a 27 y.o. G1P0 female who presents for a postpartum visit. She is 2 weeks postpartum following a Term pregnancy and delivery by C-section failure to progress.  I have fully reviewed the prenatal and intrapartum course. Anesthesia: epidural.  Postpartum course has been complicated by uncomplicated.  Baby is feeding by Breast.  Bleeding: patient has not  resumed menses.  Bowel function is normal. Bladder function is normal.  Patient is not sexually active. Contraception method desired is oral progesterone-only contraceptive.  Postpartum depression screening: negative The following portions of the patient's history were reviewed and updated as appropriate: allergies, current medications, past family history, past medical history, past social history, past surgical history and problem list.  Review of Systems Pertinent items are noted in HPI.  Objective:    BP 134/90   Pulse 78   Ht 5' (1.524 m)   Wt 131 lb (59.4 kg)   LMP 03/02/2017   BMI 25.58 kg/m   General:  alert and no distress   Breasts:  inspection negative, no nipple discharge or bleeding, no masses or nodularity palpable  Lungs: clear to auscultation bilaterally  Heart:  regular rate and rhythm, S1, S2 normal, no murmur, click, rub or gallop  Abdomen: soft, non-tender; bowel sounds normal; no masses,  no organomegaly.   Well healed Pfannenstiel incision                            Assessment:  Post Partum Care visit 1. Postpartum care following cesarean delivery  Plan:  See orders and Patient Instructions  Discussed baby schedule and gas relief for the infant  Follow up in: 4 weeks or as needed.   Adrian Prows MD [facility] 01/01/18 3:35 PM

## 2018-01-29 ENCOUNTER — Encounter: Payer: Self-pay | Admitting: Obstetrics and Gynecology

## 2018-01-29 ENCOUNTER — Ambulatory Visit (INDEPENDENT_AMBULATORY_CARE_PROVIDER_SITE_OTHER): Payer: BLUE CROSS/BLUE SHIELD | Admitting: Obstetrics and Gynecology

## 2018-01-29 NOTE — Progress Notes (Signed)
Cancelled visit

## 2018-02-08 ENCOUNTER — Encounter: Payer: Self-pay | Admitting: Obstetrics and Gynecology

## 2018-02-08 ENCOUNTER — Ambulatory Visit (INDEPENDENT_AMBULATORY_CARE_PROVIDER_SITE_OTHER): Payer: BLUE CROSS/BLUE SHIELD | Admitting: Obstetrics and Gynecology

## 2018-02-08 VITALS — BP 100/60 | HR 65 | Ht 60.0 in | Wt 129.0 lb

## 2018-02-08 DIAGNOSIS — Z1389 Encounter for screening for other disorder: Secondary | ICD-10-CM

## 2018-02-08 DIAGNOSIS — Z30017 Encounter for initial prescription of implantable subdermal contraceptive: Secondary | ICD-10-CM

## 2018-02-08 DIAGNOSIS — D62 Acute posthemorrhagic anemia: Secondary | ICD-10-CM

## 2018-02-08 LAB — POCT HEMOGLOBIN: Hemoglobin: 11.4 g/dL (ref 11–14.6)

## 2018-02-08 MED ORDER — ETONOGESTREL 68 MG ~~LOC~~ IMPL: 68.0000 mg | DRUG_IMPLANT | Freq: Once | SUBCUTANEOUS | Status: DC

## 2018-02-08 NOTE — Progress Notes (Signed)
  OBSTETRICS POSTPARTUM CLINIC PROGRESS NOTE  Subjective:     Sharon Soto is a 28 y.o. G51P1001 female who presents for a postpartum visit. She is 6 weeks postpartum following a Term pregnancy and delivery by C-section failure to progress.  I have fully reviewed the prenatal and intrapartum course. Anesthesia: epidural.  Postpartum course has been complicated by uncomplicated.  Baby is feeding by Bottle and Breast.  Bleeding: patient has  resumed menses.  Bowel function is normal. Bladder function is normal.  Patient is sexually active. Contraception method desired is oral progesterone-only contraceptive.  Postpartum depression screening: negative. Edinburgh 4.  The following portions of the patient's history were reviewed and updated as appropriate: allergies, current medications, past family history, past medical history, past social history, past surgical history and problem list.  Review of Systems Pertinent items are noted in HPI.  Objective:    BP 100/60 (BP Location: Right Arm, Patient Position: Sitting, Cuff Size: Normal)   Pulse 65   Ht 5' (1.524 m)   Wt 129 lb (58.5 kg)   LMP 01/28/2017   Breastfeeding Yes Comment: Breast/Bottle  BMI 25.19 kg/m   General:  alert and no distress   Breasts:  inspection negative, no nipple discharge or bleeding, no masses or nodularity palpable  Lungs: clear to auscultation bilaterally  Heart:  regular rate and rhythm, S1, S2 normal, no murmur, click, rub or gallop  Abdomen: soft, non-tender; bowel sounds normal; no masses,  no organomegaly.   Well healed Pfannenstiel incision   Vulva:  normal  Vagina: normal vagina, no discharge, exudate, lesion, or erythema  Cervix:  no cervical motion tenderness and no lesions  Corpus: normal size, contour, position, consistency, mobility, non-tender  Adnexa:  normal adnexa and no mass, fullness, tenderness  Rectal Exam: Not performed.          Assessment:  Post Partum Care visit   2.  Postpartum care and examination normal  3. Anemia due to blood loss, acute improving - POCT hemoglobin   Plan:  See orders and Patient Instructions Follow up in: 1 year or as needed.   Given information on various long acting birth control options. She is undecided at this time. Will schedule if she desires.   Adrian Prows MD [facility] 02/08/2018 11:01 AM

## 2018-03-03 NOTE — Telephone Encounter (Signed)
This encounter was created in error - please disregard.

## 2018-08-04 IMAGING — US US OB TRANSVAGINAL
1 series · 14 of 28 positions shown · non-contrast
Comparison: None.

CLINICAL DATA: Pregnant patient with cramping.

EXAM:
OBSTETRIC <14 WK US AND TRANSVAGINAL OB US
TECHNIQUE: Both transabdominal and transvaginal ultrasound examinations were
performed for complete evaluation of the gestation as well as the
maternal uterus, adnexal regions, and pelvic cul-de-sac.
Transvaginal technique was performed to assess early pregnancy.

[Series 1: us ob transvaginal · 75 acquisitions, 14 frames shown]
[im 3/75]
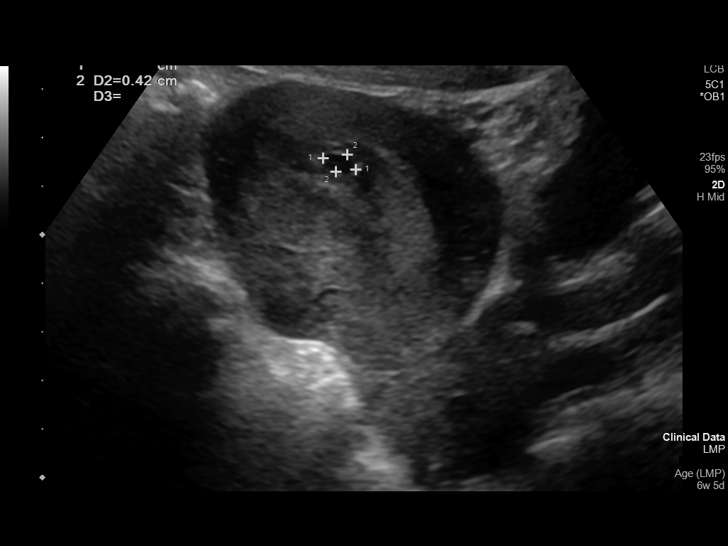
[im 9/75]
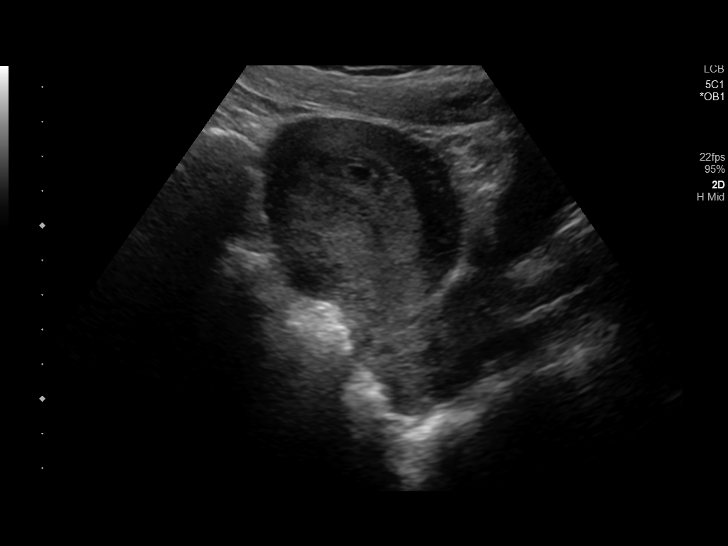
[im 14/75]
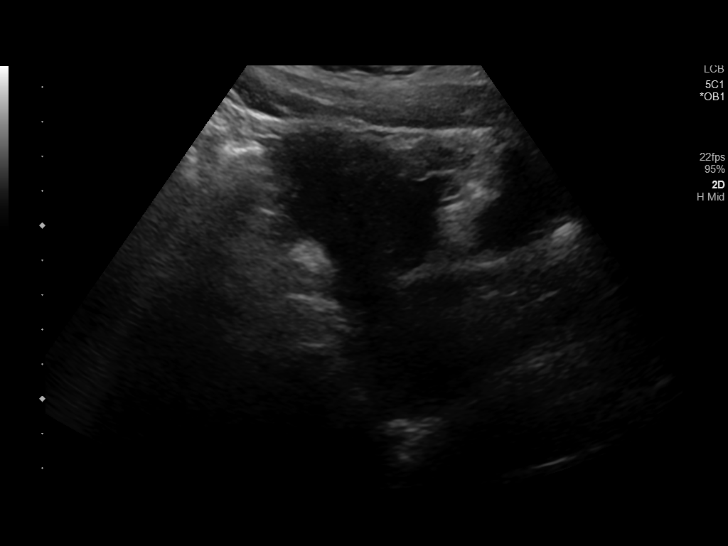
[im 20/75]
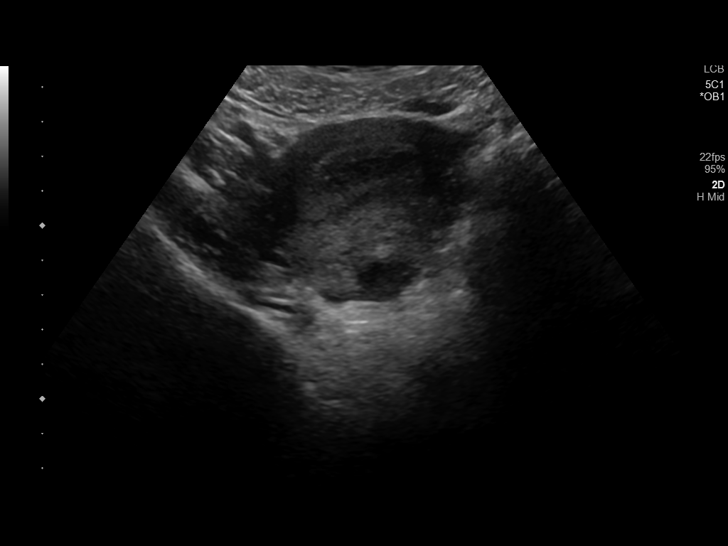
[im 25/75]
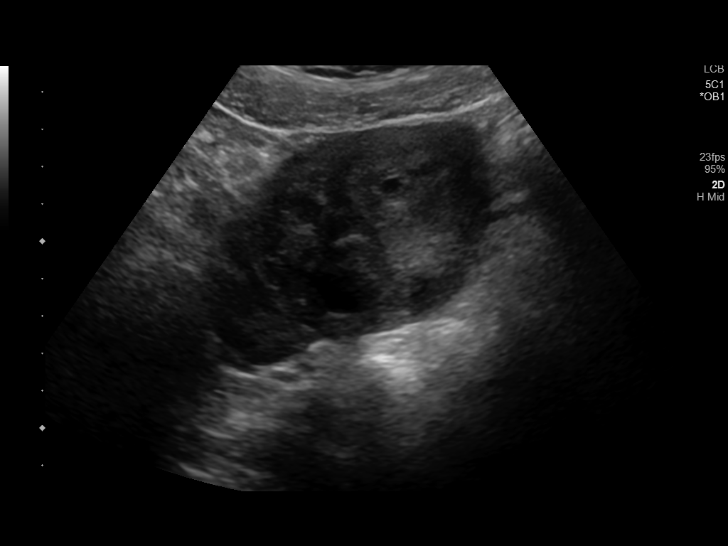
[im 31/75]
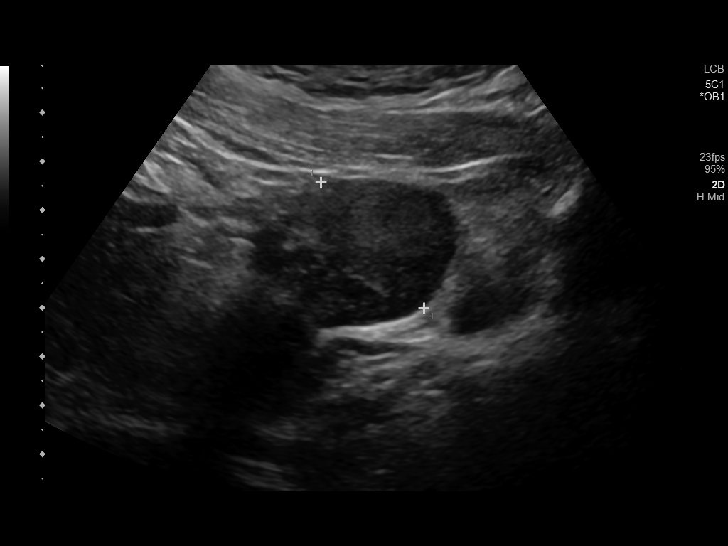
[im 36/75]
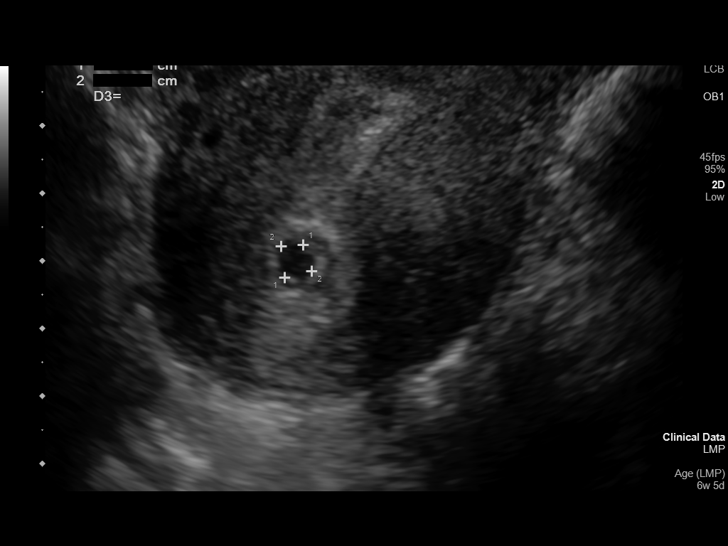
[im 42/75]
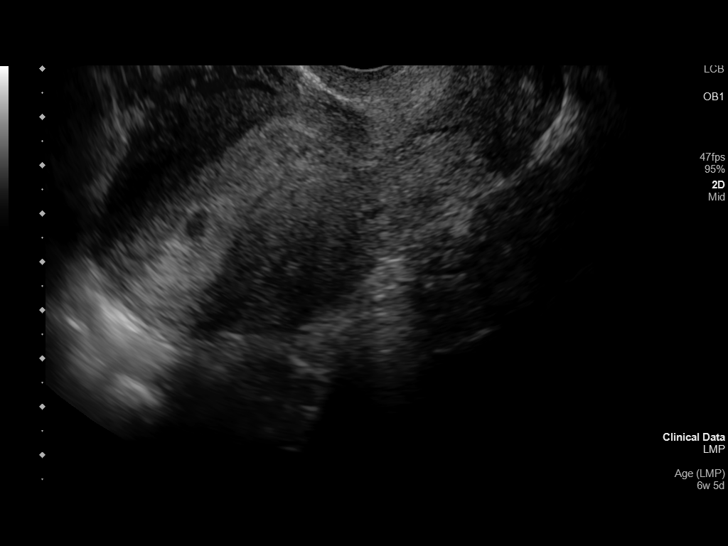
[im 47/75]
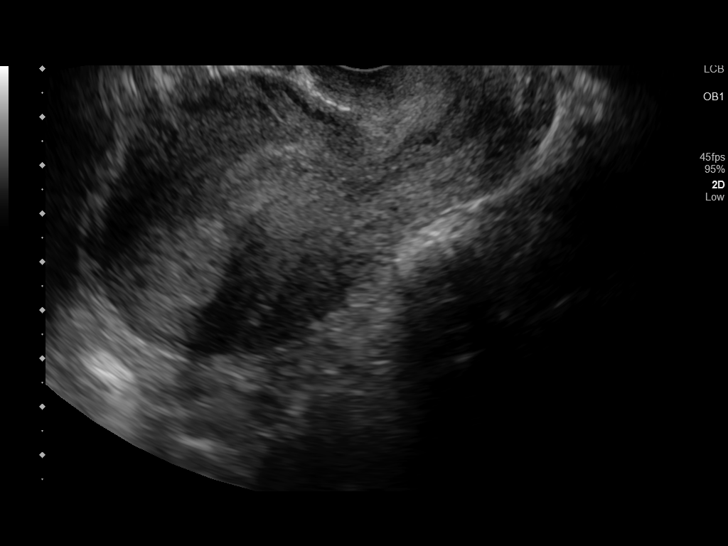
[im 53/75]
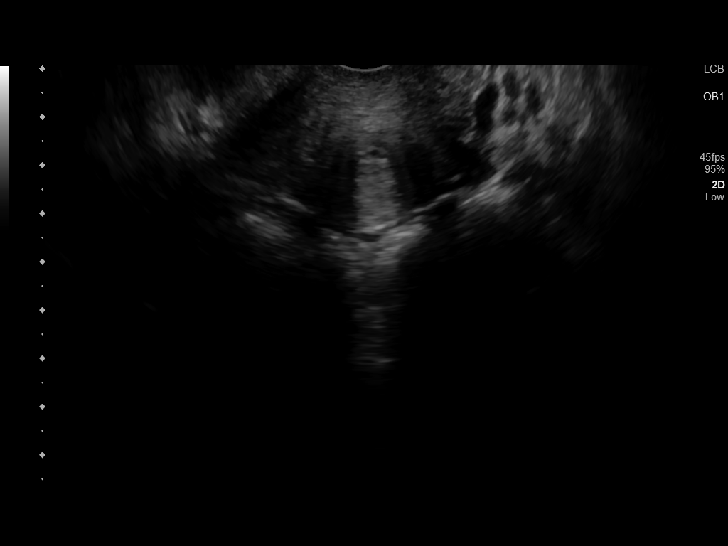
[im 58/75]
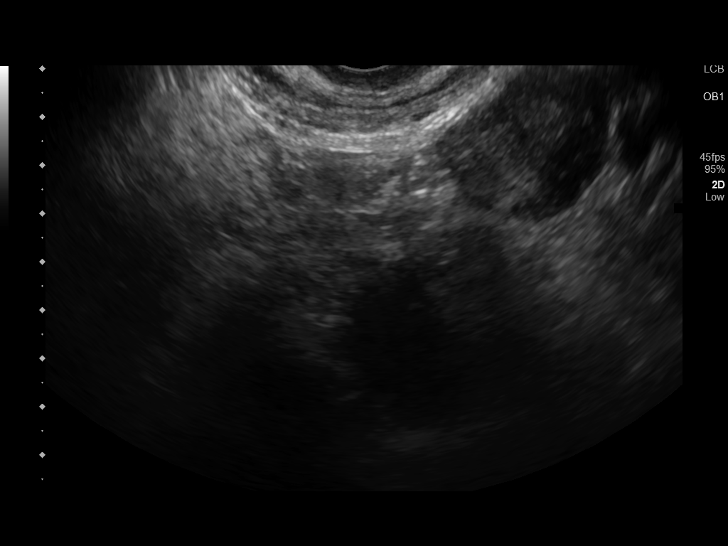
[im 64/75]
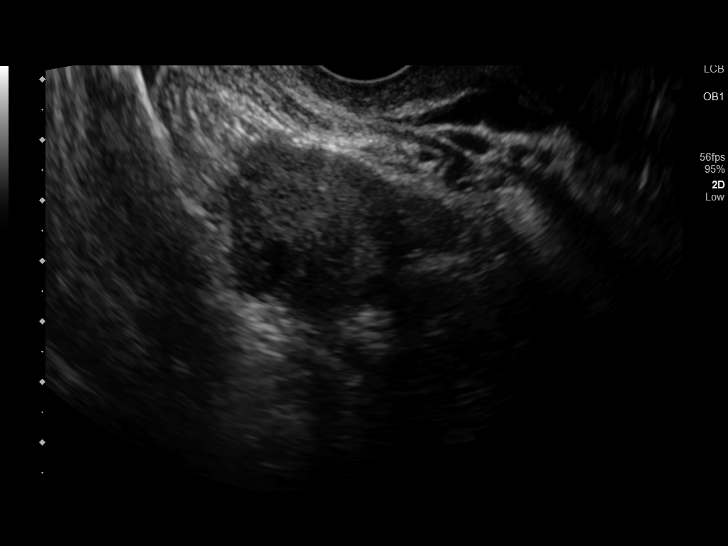
[im 69/75]
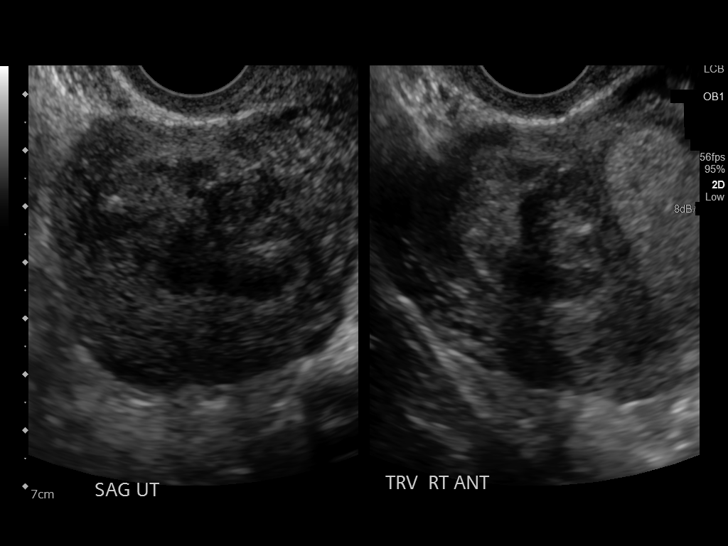
[im 75/75]
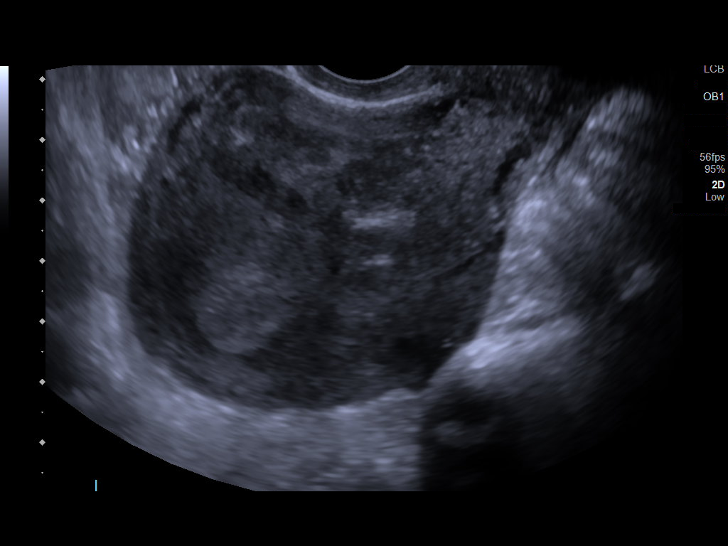

[14 of 28 positions shown; findings below may reference images not displayed]

FINDINGS: Intrauterine gestational sac: Single

Yolk sac:  Not Visualized.

Embryo:  Not Visualized.

MSD: 5.7 mm   5 w   2 d

CRL:    mm    w    d                  US EDC:

Subchorionic hemorrhage:  None visualized.

Maternal uterus/adnexae: Normal ovaries. Two fibroids seen in the
uterus. The first is seen anteriorly on the right measuring 3.3 x
4.1 x 3.0 cm and the other is located posteriorly measuring 1.6 x
1.3 x 1.6 cm
IMPRESSION: 1. There is a single IUP with a gestational sac and yolk sac. No
fetal pole seen at this time. Recommend attention on follow-up.
2. Two fibroids in the uterus.

## 2018-10-05 ENCOUNTER — Ambulatory Visit
Admission: EM | Admit: 2018-10-05 | Discharge: 2018-10-05 | Disposition: A | Discharge status: Home / Self Care | Payer: Medicaid Other

## 2018-10-05 ENCOUNTER — Ambulatory Visit (INDEPENDENT_AMBULATORY_CARE_PROVIDER_SITE_OTHER)
Admission: RE | Admit: 2018-10-05 | Discharge: 2018-10-05 | Disposition: A | Discharge status: Home / Self Care | Payer: BLUE CROSS/BLUE SHIELD | Source: Ambulatory Visit

## 2018-10-05 DIAGNOSIS — R21 Rash and other nonspecific skin eruption: Secondary | ICD-10-CM

## 2018-10-05 DIAGNOSIS — L299 Pruritus, unspecified: Secondary | ICD-10-CM

## 2018-10-05 DIAGNOSIS — B029 Zoster without complications: Secondary | ICD-10-CM

## 2018-10-05 MED ORDER — VALACYCLOVIR HCL 1 G PO TABS: 1000.0000 mg | ORAL_TABLET | Freq: Three times a day (TID) | ORAL | 0 refills | Status: AC

## 2018-10-05 MED ORDER — PREDNISONE 20 MG PO TABS: 20.0000 mg | ORAL_TABLET | Freq: Two times a day (BID) | ORAL | 0 refills | Status: AC

## 2018-10-05 NOTE — ED Provider Notes (Signed)
[facility]     Virtual Visit via Video Note:  TASHEMA MCATEE  initiated request for Telemedicine visit with [facility] team. I connected with Melvenia Needles  on 10/05/2018 at 12:38 PM  for a synchronized telemedicine visit using a video enabled HIPPA compliant telemedicine application. I verified that I am speaking with Melvenia Needles  using two identifiers. Lestine Box, PA-C  was physically located in a [facility] Urgent care site and KURSTON MINERVINI was located at a different location.   The limitations of evaluation and management by telemedicine as well as the availability of in-person appointments were discussed. Patient was informed that she  may incur a bill ( including co-pay) for this virtual visit encounter. Melvenia Needles  expressed understanding and gave verbal consent to proceed with virtual visit.   [id number] 10/05/18 Arrival Time: 1223  CC: Rash   SUBJECTIVE:  Sharon Soto is a 28 y.o. female who presents with a rash to neck and shoulder x 4 days.  Denies precipitating event or trauma.  Denies changes in soaps, detergents, close contacts with similar rash, known trigger or allergy. Denies medications change or starting a new medication recently.  Localizes the rash to RT side of neck and RT top of shoulder.  Describes it as burning, itching, and sore with blisters.  Has tried benadryl cream and aloe vera with minimal relief.  Symptoms are made worse to the touch.  Denies similar symptoms in the past.   Denies fever, chills, nausea, vomiting, swelling, discharge, SOB, chest pain, abdominal pain, changes in bowel or bladder function.    ROS: As per HPI.  All other pertinent ROS negative.     Past Medical History:  Diagnosis Date  . Condyloma acuminata   . Uterine fibroid during pregnancy, antepartum    Past Surgical History:  Procedure Laterality Date  . CESAREAN SECTION N/A 12/20/2017   Procedure: CESAREAN SECTION;  Surgeon:  Homero Fellers, MD;  Location: [facility] ORS;  Service: Obstetrics;  Laterality: N/A;   No Known Allergies No current facility-administered medications on file prior to encounter.    Current Outpatient Medications on File Prior to Encounter  Medication Sig Dispense Refill  . norethindrone (MICRONOR,CAMILA,ERRIN) 0.35 MG tablet Take 1 tablet (0.35 mg total) by mouth daily. 1 Package 11  . Prenatal MV-Min-FA-Omega-3 (PRENATAL GUMMIES/DHA & FA PO) Take by mouth.    . [DISCONTINUED] fluticasone (FLONASE) 50 MCG/ACT nasal spray Place 1 spray into both nostrils 2 (two) times daily. (Patient not taking: Reported on 07/02/2017) 16 g 0    OBJECTIVE: There were no vitals filed for this visit.  General appearance: alert; no distress Eyes: EOMI grossly HENT: normocephalic; atraumatic Neck: supple with FROM Lungs: normal respiratory effort; speaking in full sentences without difficulty Extremities: moves extremities without difficulty Skin: Crops of red/purplish papules over C4/5 dermatome on the RT side (see picture) Neurologic: normal facial expressions Psychological: alert and cooperative; normal mood and affect    ASSESSMENT & PLAN:  1. Herpes zoster without complication   2. Rash and nonspecific skin eruption   3. Itching     Meds ordered this encounter  Medications  . valACYclovir (VALTREX) 1000 MG tablet    Sig: Take 1 tablet (1,000 mg total) by mouth 3 (three) times daily for 10 days.    Dispense:  30 tablet    Refill:  0    Order Specific Question:   Supervising Provider    Answer:  Blanchie Serve SUE [id number]  . predniSONE (DELTASONE) 20 MG tablet    Sig: Take 1 tablet (20 mg total) by mouth 2 (two) times daily with a meal for 5 days.    Dispense:  10 tablet    Refill:  0    Order Specific Question:   Supervising Provider    Answer:   Raylene Everts [id number]    Rest and use ice/heat as needed for symptomatic relief Prescribed valacyclovir 1000mg  3x/day for 10  days Prescribed prednisone taper for inflammation and pain Use OTC medications such as ibuprofen/ tylenol.  Use tramadol as needed for break-through pain Follow up with PCP in 7-10 days if rash is still present Follow up with PCP if symptoms of burning, stinging, tingling or numbness occur after rash resolves, you may need additional treatment Follow up in person or go to ER if you have any new or worsening symptoms (such as eye involvement, severe pain, or signs of secondary infection such as fever, chills, nausea, vomiting, discharge, redness or warmth over site of rash)   Patient also shows me rash on her child's genital.  Unsure of origin, recommended child be evaluated in person for further evaluation and management.    I discussed the assessment and treatment plan with the patient. The patient was provided an opportunity to ask questions and all were answered. The patient agreed with the plan and demonstrated an understanding of the instructions.   The patient was advised to call back or seek an in-person evaluation if the symptoms worsen or if the condition fails to improve as anticipated.  I provided 11 minutes of non-face-to-face time during this encounter.  Lestine Box, PA-C  10/05/2018 12:38 PM    Lestine Box, PA-C 10/05/18 1242

## 2018-10-05 NOTE — Discharge Instructions (Addendum)
Rest and use ice/heat as needed for symptomatic relief Prescribed valacyclovir 1000mg  3x/day for 10 days Prescribed prednisone taper for inflammation and pain Use OTC medications such as ibuprofen/ tylenol.  Use tramadol as needed for break-through pain Follow up with PCP in 7-10 days if rash is still present Follow up with PCP if symptoms of burning, stinging, tingling or numbness occur after rash resolves, you may need additional treatment Follow up in person or go to ER if you have any new or worsening symptoms (such as eye involvement, severe pain, or signs of secondary infection such as fever, chills, nausea, vomiting, discharge, redness or warmth over site of rash)

## 2018-11-30 ENCOUNTER — Telehealth: Payer: Medicaid Other | Admitting: Physician Assistant

## 2018-11-30 ENCOUNTER — Encounter (INDEPENDENT_AMBULATORY_CARE_PROVIDER_SITE_OTHER): Payer: Self-pay

## 2018-11-30 DIAGNOSIS — Z20828 Contact with and (suspected) exposure to other viral communicable diseases: Secondary | ICD-10-CM

## 2018-11-30 DIAGNOSIS — R509 Fever, unspecified: Secondary | ICD-10-CM

## 2018-11-30 DIAGNOSIS — Z20822 Contact with and (suspected) exposure to covid-19: Secondary | ICD-10-CM

## 2018-11-30 NOTE — Progress Notes (Signed)
E-Visit for Corona Virus Screening   Your current symptoms could be consistent with the coronavirus, especially given your recent exposure.  Many health care providers can now test patients at their office but not all are.  [place] has multiple testing sites - you do not need an appointment or a lab order. For information on our COVID testing locations and hours go to [email or web address]  Please quarantine yourself while awaiting your test results.    We are enrolling you in our [place] [facility] . Daily you will receive a questionnaire within the [place] website. Our COVID 19 response team willl be monitoriing your responses daily.   COVID-19 is a respiratory illness with symptoms that are similar to the flu. Symptoms are typically mild to moderate, but there have been cases of severe illness and death due to the virus. The following symptoms may appear 2-14 days after exposure: . Fever . Cough . Shortness of breath or difficulty breathing . Chills . Repeated shaking with chills . Muscle pain . Headache . Sore throat . New loss of taste or smell . Fatigue . Congestion or runny nose . Nausea or vomiting . Diarrhea  It is vitally important that if you feel that you have an infection such as this virus or any other virus that you stay home and away from places where you may spread it to others.  You should self-quarantine for 14 days if you have symptoms that could potentially be coronavirus or have been in close contact a with a person diagnosed with COVID-19 within the last 2 weeks. You should avoid contact with people age 19 and older.   You should wear a mask or cloth face covering over your nose and mouth if you must be around other people or animals, including pets (even at home). Try to stay at least 6 feet away from other people. This will protect the people around you.  You may also take acetaminophen (Tylenol) as needed for  fever.   Reduce your risk of any infection by using the same precautions used for avoiding the common cold or flu:  Marland Kitchen Wash your hands often with soap and warm water for at least 20 seconds.  If soap and water are not readily available, use an alcohol-based hand sanitizer with at least 60% alcohol.  . If coughing or sneezing, cover your mouth and nose by coughing or sneezing into the elbow areas of your shirt or coat, into a tissue or into your sleeve (not your hands). . Avoid shaking hands with others and consider head nods or verbal greetings only. . Avoid touching your eyes, nose, or mouth with unwashed hands.  . Avoid close contact with people who are sick. . Avoid places or events with large numbers of people in one location, like concerts or sporting events. . Carefully consider travel plans you have or are making. . If you are planning any travel outside or inside the [place], visit the CDC's Travelers' Health webpage for the latest health notices. . If you have some symptoms but not all symptoms, continue to monitor at home and seek medical attention if your symptoms worsen. . If you are having a medical emergency, call 911.  HOME CARE . Only take medications as instructed by your medical team. . Drink plenty of fluids and get plenty of rest. . A steam or ultrasonic humidifier can help if you have congestion.   GET HELP RIGHT AWAY IF YOU HAVE EMERGENCY WARNING SIGNS** FOR  COVID-19. If you or someone is showing any of these signs seek emergency medical care immediately. Call 911 or proceed to your closest emergency facility if: . You develop worsening high fever. . Trouble breathing . Bluish lips or face . Persistent pain or pressure in the chest . New confusion . Inability to wake or stay awake . You cough up blood. . Your symptoms become more severe  **This list is not all possible symptoms. Contact your medical provider for any symptoms that are sever or concerning to you.   MAKE  SURE YOU   Understand these instructions.  Will watch your condition.  Will get help right away if you are not doing well or get worse.  Your e-visit answers were reviewed by a board certified advanced clinical practitioner to complete your personal care plan.  Depending on the condition, your plan could have included both over the counter or prescription medications.  If there is a problem please reply once you have received a response from your provider.  Your safety is important to [place].  If you have drug allergies check your prescription carefully.    You can use MyChart to ask questions about today's visit, request a non-urgent call back, or ask for a work or school excuse for 24 hours related to this e-Visit. If it has been greater than 24 hours you will need to follow up with your provider, or enter a new e-Visit to address those concerns. You will get an e-mail in the next two days asking about your experience.  I hope that your e-visit has been valuable and will speed your recovery. Thank you for using e-visits.   Greater than 5 minutes, yet less than 10 minutes of time have been spent researching, coordinating and implementing care for this patient today.

## 2019-07-03 ENCOUNTER — Encounter: Payer: Self-pay | Admitting: Emergency Medicine

## 2019-07-03 ENCOUNTER — Ambulatory Visit
Admission: EM | Admit: 2019-07-03 | Discharge: 2019-07-03 | Disposition: A | Discharge status: Home / Self Care | Payer: Medicaid Other | Attending: Emergency Medicine | Admitting: Emergency Medicine

## 2019-07-03 ENCOUNTER — Other Ambulatory Visit: Payer: Self-pay

## 2019-07-03 DIAGNOSIS — S81811A Laceration without foreign body, right lower leg, initial encounter: Secondary | ICD-10-CM

## 2019-07-03 MED ORDER — MUPIROCIN 2 % EX OINT: 1.0000 "application " | TOPICAL_OINTMENT | Freq: Three times a day (TID) | CUTANEOUS | 0 refills | Status: DC

## 2019-07-03 NOTE — ED Provider Notes (Signed)
[facility]    CSN: [id number] Arrival date & time: 07/03/19  0856      History   Chief Complaint Chief Complaint  Patient presents with   Extremity Laceration    right    HPI Sharon Soto is a 29 y.o. female.   HPI  29 year old female presents today with an injury to her right ankle that occurred around 7 AM at work.  She was pulling her chart to carts when another carts ran pop on her heel of her right leg causing a laceration.  She is current on her tetanus       Past Medical History:  Diagnosis Date   Condyloma acuminata    Uterine fibroid during pregnancy, antepartum     Patient Active Problem List   Diagnosis Date Noted   Indication for care in labor and delivery, antepartum 12/20/2017   Normal labor 12/20/2017   Uterine fibroid in pregnancy 12/07/2017   Supervision of normal first pregnancy, antepartum 04/23/2017   Condyloma acuminata 08/06/2016    Past Surgical History:  Procedure Laterality Date   CESAREAN SECTION N/A 12/20/2017   Procedure: CESAREAN SECTION;  Surgeon: Homero Fellers, MD;  Location: [facility] ORS;  Service: Obstetrics;  Laterality: N/A;    OB History    Gravida  1   Para  1   Term  1   Preterm      AB      Living  1     SAB      TAB      Ectopic      Multiple      Live Births  1            Home Medications    Prior to Admission medications   Medication Sig Start Date End Date Taking? Authorizing Provider  mupirocin ointment (BACTROBAN) 2 % Apply 1 application topically 3 (three) times daily. 07/03/19   Lorin Picket, PA-C  fluticasone (FLONASE) 50 MCG/ACT nasal spray Place 1 spray into both nostrils 2 (two) times daily. Patient not taking: Reported on 07/02/2017 05/30/15 10/05/18  Olen Cordial, NP  norethindrone (MICRONOR,CAMILA,ERRIN) 0.35 MG tablet Take 1 tablet (0.35 mg total) by mouth daily. 12/23/17 07/03/19  Rexene Agent, CNM    Family History Family History    Problem Relation Age of Onset   Healthy Mother    Healthy Father     Social History Social History   Tobacco Use   Smoking status: Former Smoker    Packs/day: 0.25    Years: 1.00    Pack years: 0.25   Smokeless tobacco: Never Used  Scientific laboratory technician Use: Never used  Substance Use Topics   Alcohol use: Yes    Comment: occasionally   Drug use: No     Allergies   Patient has no known allergies.   Review of Systems Review of Systems  Constitutional: Positive for activity change. Negative for appetite change, chills, fatigue and fever.  Skin: Positive for wound.  All other systems reviewed and are negative.    Physical Exam Triage Vital Signs ED Triage Vitals  Enc Vitals Group     BP 07/03/19 0912 120/76     Pulse Rate 07/03/19 0912 76     Resp 07/03/19 0912 14     Temp 07/03/19 0912 98.3 F (36.8 C)     Temp Source 07/03/19 0912 Oral     SpO2 07/03/19 0912 100 %  Weight 07/03/19 0910 143 lb (64.9 kg)     Height 07/03/19 0910 5' (1.524 m)     Head Circumference --      Peak Flow --      Pain Score 07/03/19 0910 6     Pain Loc --      Pain Edu? --      Excl. in [place]? --    No data found.  Updated Vital Signs BP 120/76 (BP Location: Left Arm)    Pulse 76    Temp 98.3 F (36.8 C) (Oral)    Resp 14    Ht 5' (1.524 m)    Wt 143 lb (64.9 kg)    LMP 06/19/2019    SpO2 100%    Breastfeeding No    BMI 27.93 kg/m   Visual Acuity Right Eye Distance:   Left Eye Distance:   Bilateral Distance:    Right Eye Near:   Left Eye Near:    Bilateral Near:     Physical Exam Vitals and nursing note reviewed.  Constitutional:      General: She is not in acute distress.    Appearance: Normal appearance. She is not ill-appearing or toxic-appearing.  HENT:     Head: Normocephalic and atraumatic.  Eyes:     Conjunctiva/sclera: Conjunctivae normal.  Musculoskeletal:        General: Tenderness and signs of injury present. Normal range of motion.     Cervical  back: Normal range of motion and neck supple.  Skin:    General: Skin is warm and dry.     Comments: Examination of the right posterior heel area shows a transverse laceration in the skin fold overlying the insertion of the Achilles.  The wound is clean.  She has normal range of motion of her ankle and foot.  Resisted plantar flexion is strong and intact.  There is no injury to the Achilles.  Neurological:     General: No focal deficit present.     Mental Status: She is alert and oriented to person, place, and time.  Psychiatric:        Mood and Affect: Mood normal.        Behavior: Behavior normal.        Thought Content: Thought content normal.        Judgment: Judgment normal.      UC Treatments / Results  Labs (all labs ordered are listed, but only abnormal results are displayed) Labs Reviewed - No data to display  EKG   Radiology No results found.  Procedures Laceration Repair  Date/Time: 07/03/2019 10:31 AM Performed by: Lorin Picket, PA-C Authorized by: Lorin Picket, PA-C   Consent:    Consent obtained:  Verbal   Consent given by:  Patient   Risks discussed:  Infection and pain Anesthesia (see MAR for exact dosages):    Anesthesia method:  Local infiltration   Local anesthetic:  Lidocaine 1% WITH epi Laceration details:    Location:  Leg   Leg location:  R lower leg   Length (cm):  2   Depth (mm):  2 Repair type:    Repair type:  Simple Pre-procedure details:    Preparation:  Patient was prepped and draped in usual sterile fashion Exploration:    Hemostasis achieved with:  Direct pressure and epinephrine   Wound exploration: entire depth of wound probed and visualized     Contaminated: no   Treatment:    Area cleansed with:  Betadine   Amount of cleaning:  Standard   Irrigation solution:  Tap water   Irrigation volume:  60   Irrigation method:  Pressure wash   Visualized foreign bodies/material removed: no   Skin repair:    Repair method:   Sutures   Suture size:  4-0   Suture material:  Nylon   Number of sutures:  4 Approximation:    Approximation:  Close Post-procedure details:    Dressing:  Antibiotic ointment, non-adherent dressing and sterile dressing   Patient tolerance of procedure:  Tolerated well, no immediate complications Comments:       Discharge Instructions   Keep the wound dry for 24 hours.  Then you may wash as usual.  Apply mupirocin ointment 3 times daily.  Cover with a cotton sock.  If any signs or symptoms of infection that we discussed occur please return to our clinic immediately.  Plan on removal of the sutures in 14 days     (including critical care time)  Medications Ordered in UC Medications - No data to display  Initial Impression / Assessment and Plan / UC Course  I have reviewed the triage vital signs and the nursing notes.  Pertinent labs & imaging results that were available during my care of the patient were reviewed by me and considered in my medical decision making (see chart for details).   29 year old female presents today with a laceration to the right posterior heel.  She was pulling to carts at work when one of the carts struck the posterior portion of her heel at the ancillaries tendon insertion level.  She has sustained a 2 cm transverse laceration in the skin fold.  She is a current on her tetanus toxoid.  The laceration was closed please refer to the procedural note.  Tolerated procedure well.  Signs and symptoms of infection were detailed to the patient.  Keep the area dry for 24 hours and then begin a well washing program with application of mupirocin ointment 3 times daily covered with a cotton sock.  She has no evidence of infection she will return to our clinic in 14 days for suture removal   Final Clinical Impressions(s) / UC Diagnoses   Final diagnoses:  Laceration of right leg excluding thigh, initial encounter     Discharge Instructions     Keep the wound dry  for 24 hours.  Then you may wash as usual.  Apply mupirocin ointment 3 times daily.  Cover with a cotton sock.  If any signs or symptoms of infection that we discussed occur please return to our clinic immediately.  Plan on removal of the sutures in 14 days    ED Prescriptions    Medication Sig Dispense Auth. Provider   mupirocin ointment (BACTROBAN) 2 % Apply 1 application topically 3 (three) times daily. 22 g Lorin Picket, PA-C     PDMP not reviewed this encounter.   Lorin Picket, PA-C 07/03/19 1035

## 2019-07-03 NOTE — Discharge Instructions (Addendum)
Keep the wound dry for 24 hours.  Then you may wash as usual.  Apply mupirocin ointment 3 times daily.  Cover with a cotton sock.  If any signs or symptoms of infection that we discussed occur please return to our clinic immediately.  Plan on removal of the sutures in 14 days

## 2019-07-03 NOTE — ED Triage Notes (Signed)
Patient states that she cut her right ankle at work around [place] on a metal rack.  Patient states that she is not doing worker's comp.

## 2019-07-26 ENCOUNTER — Ambulatory Visit
Admission: EM | Admit: 2019-07-26 | Discharge: 2019-07-26 | Discharge status: Against Medical Advice | Payer: Medicaid Other

## 2019-08-02 ENCOUNTER — Ambulatory Visit
Admission: EM | Admit: 2019-08-02 | Discharge: 2019-08-02 | Disposition: A | Discharge status: Home / Self Care | Payer: Medicaid Other | Attending: Family Medicine | Admitting: Family Medicine

## 2019-08-02 DIAGNOSIS — S81811D Laceration without foreign body, right lower leg, subsequent encounter: Secondary | ICD-10-CM

## 2019-08-02 NOTE — ED Notes (Signed)
3 sutures removed. Per chart 4 placed. MD cook to bedside. Pt okay to leave, no evidence of sutures left.

## 2019-09-30 ENCOUNTER — Encounter: Payer: Self-pay | Admitting: Physician Assistant

## 2019-09-30 ENCOUNTER — Other Ambulatory Visit: Payer: Self-pay

## 2019-09-30 ENCOUNTER — Ambulatory Visit (LOCAL_COMMUNITY_HEALTH_CENTER): Payer: Medicaid Other | Admitting: Physician Assistant

## 2019-09-30 ENCOUNTER — Ambulatory Visit (LOCAL_COMMUNITY_HEALTH_CENTER): Payer: Medicaid Other

## 2019-09-30 VITALS — BP 113/80 | Ht 59.5 in | Wt 147.0 lb

## 2019-09-30 DIAGNOSIS — Z0000 Encounter for general adult medical examination without abnormal findings: Secondary | ICD-10-CM | POA: Diagnosis not present

## 2019-09-30 DIAGNOSIS — N760 Acute vaginitis: Secondary | ICD-10-CM

## 2019-09-30 DIAGNOSIS — Z111 Encounter for screening for respiratory tuberculosis: Secondary | ICD-10-CM

## 2019-09-30 DIAGNOSIS — Z3009 Encounter for other general counseling and advice on contraception: Secondary | ICD-10-CM

## 2019-09-30 DIAGNOSIS — Z113 Encounter for screening for infections with a predominantly sexual mode of transmission: Secondary | ICD-10-CM

## 2019-09-30 DIAGNOSIS — Z30011 Encounter for initial prescription of contraceptive pills: Secondary | ICD-10-CM

## 2019-09-30 LAB — WET PREP FOR TRICH, YEAST, CLUE
Trichomonas Exam: NEGATIVE
Yeast Exam: NEGATIVE

## 2019-09-30 MED ORDER — NORGESTIMATE-ETH ESTRADIOL 0.25-35 MG-MCG PO TABS: 1.0000 | ORAL_TABLET | Freq: Every day | ORAL | 12 refills | Status: DC

## 2019-09-30 MED ORDER — METRONIDAZOLE 500 MG PO TABS: 500.0000 mg | ORAL_TABLET | Freq: Two times a day (BID) | ORAL | 0 refills | Status: AC

## 2019-09-30 NOTE — Progress Notes (Addendum)
Here today for PE and secondary PPD appt. PPD placed. See FP chart for PPD documentation. Hal Morales, RN

## 2019-09-30 NOTE — Progress Notes (Addendum)
Here today for PE and PPD. Last PE and CBE here was 03/02/2018, last Pap Smear was 05/05/2017 at [facility] (copy in chart.) Wants STD screening today including bloodwork. PPD placed and instructed patient to schedule a PPDR appt at completion of FP appt. Hal Morales, RN

## 2019-10-01 ENCOUNTER — Encounter: Payer: Self-pay | Admitting: Physician Assistant

## 2019-10-01 NOTE — Progress Notes (Signed)
Family Planning Visit- Repeat Yearly Visit  Subjective:  Sharon Soto is a 29 y.o. G1P1001  being seen today for an well woman visit and to discuss family planning options.    She is currently using None for pregnancy prevention. Patient reports she does not  want a pregnancy in the next year. Patient  has Condyloma acuminata; Supervision of normal first pregnancy, antepartum; Uterine fibroid in pregnancy; Indication for care in labor and delivery, antepartum; and Normal labor on their problem list.  Chief Complaint  Patient presents with  . Contraception    Physical    Patient reports that she would like to restart OCs.  Reports that she was previously on Sprintec without problems and would like to try that again.  Reports vaginal discharge with odor for 2 days and denies other symptoms.  Per chart review, CBE is due 2023 and pap is due 2022.  Patient denies any other concerns today.    See flowsheet for other program required questions.   Body mass index is 29.19 kg/m. - Patient is eligible for diabetes screening based on BMI and age >16?  not applicable RC7E ordered? not applicable  Patient reports 1 partners in last year. Desires STI screening?  Yes   Has patient been screened once for HCV in the past?  No  No results found for: HCVAB  Does the patient have current of drug use, have a partner with drug use, and/or has been incarcerated since last result? No  If yes-- Screen for HCV through [facility]   Does the patient meet criteria for HBV testing? No  Criteria:  -Household, sexual or needle sharing contact with HBV -History of drug use -HIV positive -Those with known Hep C   Health Maintenance Due  Topic Date Due  . Hepatitis C Screening  Never done  . COVID-19 Vaccine (1) Never done  . INFLUENZA VACCINE  08/21/2019    Review of Systems  All other systems reviewed and are negative.   The following portions of the patient's history were reviewed and  updated as appropriate: allergies, current medications, past family history, past medical history, past social history, past surgical history and problem list. Problem list updated.  Objective:   Vitals:   09/30/19 0940  BP: 113/80  Weight: 147 lb (66.7 kg)  Height: 4' 11.5" (1.511 m)    Physical Exam Vitals and nursing note reviewed.  Constitutional:      General: She is not in acute distress.    Appearance: Normal appearance.  HENT:     Head: Normocephalic and atraumatic.  Eyes:     Conjunctiva/sclera: Conjunctivae normal.  Neck:     Thyroid: No thyroid mass, thyromegaly or thyroid tenderness.  Cardiovascular:     Rate and Rhythm: Normal rate and regular rhythm.  Pulmonary:     Effort: Pulmonary effort is normal.     Breath sounds: Normal breath sounds.  Abdominal:     Palpations: Abdomen is soft. There is no mass.     Tenderness: There is no abdominal tenderness. There is no guarding or rebound.  Musculoskeletal:     Cervical back: Neck supple. No tenderness.  Lymphadenopathy:     Cervical: No cervical adenopathy.  Skin:    General: Skin is warm and dry.  Neurological:     Mental Status: She is alert and oriented to person, place, and time.  Psychiatric:        Mood and Affect: Mood normal.  Behavior: Behavior normal.        Thought Content: Thought content normal.        Judgment: Judgment normal.     Patient requested to self-collect vaginal samples for testing today.  Counseled patient how to collect samples for most accurate results.   Assessment and Plan:  Sharon Soto is a 29 y.o. female G1P1001 presenting to the [facility] Department for an yearly well woman exam/family planning visit  Contraception counseling: Reviewed all forms of birth control options in the tiered based approach. available including abstinence; over the counter/barrier methods; hormonal contraceptive medication including pill, patch, ring, injection,contraceptive  implant, ECP; hormonal and nonhormonal IUDs; permanent sterilization options including vasectomy and the various tubal sterilization modalities. Risks, benefits, and typical effectiveness rates were reviewed.  Questions were answered.  Written information was also given to the patient to review.  Patient desires to start OCs, this was prescribed for patient. She will follow up in  1 year and prn for surveillance.  She was told to call with any further questions, or with any concerns about this method of contraception.  Emphasized use of condoms 100% of the time for STI prevention.  Patient was offered ECP. ECP was not accepted by the patient. ECP counseling was given.  1. Screening examination for pulmonary tuberculosis See RN note. - PPD  2. Encounter for counseling regarding contraception Reviewed as above with patient re:  BCMs and their risks, benefits and SE. Patient opts to start OCs. Rec condoms with all sex for STD protection and especially either thru first cycle of OC if opts to start today or in interim and for 2 weeks of first cycle if opts to start with next menses.  3. Screening for STD (sexually transmitted disease) Await test results.  Counseled that RN will call if needs to RTC for further treatment once results are back.  - WET PREP FOR TRICH, YEAST, CLUE - Chlamydia/Gonorrhea [place] [facility] - HIV/HCV [facility] - Syphilis Serology, [place] [facility]  4. OCP (oral contraceptive pills) initiation OK to start Sprintec 28 d 1 po daily at the same time each day either today or at onset of next menses per patient preference. Counseled that if starts OC today she may have bleeding mid cycle and again at the end of the cycle due to starting pack late but that this would only be for the first cycle as long as she takes the pills correctly. Rx will be sent to patient's pharmacy of choice with refills for 1 year. - norgestimate-ethinyl estradiol (SPRINTEC 28) 0.25-35 MG-MCG tablet; Take 1  tablet by mouth daily.  Dispense: 28 tablet; Refill: 12  5. BV (bacterial vaginosis) Treat for BV with Metronidazole 500 mg #14 1 po BID for 7 days with food, no EtOH for 24 hr before and until 72 hr after taking medicine. No sex for 7 days. Enc to use OTC antifungal cream if has itching during or just after completing antibiotics. - metroNIDAZOLE (FLAGYL) 500 MG tablet; Take 1 tablet (500 mg total) by mouth 2 (two) times daily for 7 days.  Dispense: 14 tablet; Refill: 0  6. Well woman exam (no gynecological exam) Reviewed with patient healthy habits for general health. Enc MVI 1 po daily. Enc to establish with/follow up with PCP for primary care concerns and illness.   No follow-ups on file.  Future Appointments  Date Time Provider [place]  10/03/2019  9:40 AM [facility] NURSE [facility] None  Jerene Dilling, PA

## 2019-10-03 ENCOUNTER — Ambulatory Visit (LOCAL_COMMUNITY_HEALTH_CENTER): Payer: Medicaid Other

## 2019-10-03 ENCOUNTER — Other Ambulatory Visit: Payer: Self-pay

## 2019-10-03 DIAGNOSIS — Z111 Encounter for screening for respiratory tuberculosis: Secondary | ICD-10-CM

## 2019-10-03 LAB — TB SKIN TEST
Induration: 0 mm
TB Skin Test: NEGATIVE

## 2019-10-07 LAB — HM HIV SCREENING LAB: HM HIV Screening: NEGATIVE

## 2019-10-07 LAB — HM HEPATITIS C SCREENING LAB: HM Hepatitis Screen: NEGATIVE

## 2019-10-10 ENCOUNTER — Encounter: Payer: Self-pay | Admitting: Family Medicine

## 2020-06-26 ENCOUNTER — Other Ambulatory Visit: Payer: Self-pay

## 2020-06-26 ENCOUNTER — Other Ambulatory Visit (HOSPITAL_COMMUNITY)
Admission: RE | Admit: 2020-06-26 | Discharge: 2020-06-26 | Disposition: A | Discharge status: Home / Self Care | Payer: Medicaid Other | Source: Ambulatory Visit | Attending: Advanced Practice Midwife | Admitting: Advanced Practice Midwife

## 2020-06-26 ENCOUNTER — Ambulatory Visit (INDEPENDENT_AMBULATORY_CARE_PROVIDER_SITE_OTHER): Payer: Medicaid Other | Admitting: Advanced Practice Midwife

## 2020-06-26 ENCOUNTER — Encounter: Payer: Self-pay | Admitting: Advanced Practice Midwife

## 2020-06-26 VITALS — BP 100/70 | Wt 149.0 lb

## 2020-06-26 DIAGNOSIS — Z1379 Encounter for other screening for genetic and chromosomal anomalies: Secondary | ICD-10-CM

## 2020-06-26 DIAGNOSIS — Z369 Encounter for antenatal screening, unspecified: Secondary | ICD-10-CM | POA: Diagnosis present

## 2020-06-26 DIAGNOSIS — Z1159 Encounter for screening for other viral diseases: Secondary | ICD-10-CM

## 2020-06-26 DIAGNOSIS — Z113 Encounter for screening for infections with a predominantly sexual mode of transmission: Secondary | ICD-10-CM | POA: Insufficient documentation

## 2020-06-26 DIAGNOSIS — Z3A13 13 weeks gestation of pregnancy: Secondary | ICD-10-CM

## 2020-06-26 DIAGNOSIS — Z130 Encounter for screening for diseases of the blood and blood-forming organs and certain disorders involving the immune mechanism: Secondary | ICD-10-CM

## 2020-06-26 DIAGNOSIS — Z3490 Encounter for supervision of normal pregnancy, unspecified, unspecified trimester: Secondary | ICD-10-CM | POA: Insufficient documentation

## 2020-06-26 DIAGNOSIS — Z3482 Encounter for supervision of other normal pregnancy, second trimester: Secondary | ICD-10-CM

## 2020-06-26 DIAGNOSIS — Z124 Encounter for screening for malignant neoplasm of cervix: Secondary | ICD-10-CM | POA: Insufficient documentation

## 2020-06-26 LAB — POCT URINE PREGNANCY: Preg Test, Ur: POSITIVE — AB

## 2020-06-26 NOTE — Patient Instructions (Signed)

## 2020-06-28 ENCOUNTER — Encounter: Payer: Self-pay | Admitting: Advanced Practice Midwife

## 2020-06-28 LAB — HGB FRACTIONATION CASCADE
Hgb A2: 2.9 % (ref 1.8–3.2)
Hgb A: 97.1 % (ref 96.4–98.8)
Hgb F: 0 % (ref 0.0–2.0)
Hgb S: 0 %

## 2020-06-28 LAB — RPR+RH+ABO+RUB AB+AB SCR+CB...
Antibody Screen: NEGATIVE
HIV Screen 4th Generation wRfx: NONREACTIVE
Hematocrit: 35.3 % (ref 34.0–46.6)
Hemoglobin: 11.7 g/dL (ref 11.1–15.9)
Hepatitis B Surface Ag: NEGATIVE
MCH: 28.8 pg (ref 26.6–33.0)
MCHC: 33.1 g/dL (ref 31.5–35.7)
MCV: 87 fL (ref 79–97)
Platelets: 365 10*3/uL (ref 150–450)
RBC: 4.06 x10E6/uL (ref 3.77–5.28)
RDW: 12 % (ref 11.7–15.4)
RPR Ser Ql: NONREACTIVE
Rh Factor: POSITIVE
Rubella Antibodies, IGG: 3.24 index (ref >0.99)
Varicella zoster IgG: 1392 index (ref >165)
WBC: 7.5 10*3/uL (ref 3.4–10.8)

## 2020-06-28 LAB — HEPATITIS C ANTIBODY: Hep C Virus Ab: 0.1 s/co ratio (ref 0.0–0.9)

## 2020-06-28 LAB — CYTOLOGY - PAP
Chlamydia: NEGATIVE
Comment: NEGATIVE
Comment: NEGATIVE
Comment: NORMAL
Diagnosis: NEGATIVE
Neisseria Gonorrhea: NEGATIVE
Trichomonas: NEGATIVE

## 2020-06-28 LAB — HEPATITIS B SURFACE ANTIBODY,QUALITATIVE: Hep B Surface Ab, Qual: NONREACTIVE

## 2020-06-28 NOTE — Progress Notes (Signed)
New Obstetric Patient H&P    Chief Complaint: "Desires prenatal care"   History of Present Illness: Patient is a 30 y.o. G2P1001 Not Hispanic or Latino female, presents with amenorrhea and positive home pregnancy test. Patient's last menstrual period was 03/24/2020 (within weeks). and based on her  LMP, her EDD is Estimated Date of Delivery: 12/29/20 and her EGA is [redacted]w[redacted]d. Cycles are 5-7days, regular, and occur approximately every : 28 days. Her last pap smear was 3 years ago and was no abnormalities.    She had a urine pregnancy test which was positive 2 week(s)  ago. Her last menstrual period was normal and lasted for  5 or 6 day(s). Since her LMP she claims she has experienced breast tenderness, fatigue, nausea. She denies vaginal bleeding. Her past medical history is noncontributory. Her prior pregnancies are notable for  primary c/s for fetal intolerance 2019, 7#7oz. She desires repeat c/s.  Since her LMP, she admits to the use of tobacco products  no She claims she has gained  4  pounds since the start of her pregnancy.  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 no  Genetic Screening/Teratology Counseling: (Includes patient, baby's father, or anyone in either family with:)   3. Patient's age >/= 38 at [facility]  no 2. Thalassemia ([place], [place], [place], or Asian background): MCV<80  no 3. Neural tube defect (meningomyelocele, spina bifida, anencephaly)  no 4. Congenital heart defect  no  5. Down syndrome  no 6. Tay-Sachs (Jewish, [place])  no 7. Canavan's Disease  no 8. Sickle cell disease or trait (African)  no  9. Hemophilia or other blood disorders  no  10. Muscular dystrophy  no  11. Cystic fibrosis  no  12.  Huntington's Chorea  no  13. Mental retardation/autism  no 14. Other inherited genetic or chromosomal disorder  no 15. Maternal metabolic disorder (DM, PKU, etc)  no 16. Patient or FOB with a child with a birth defect not listed above no  16a. Patient or FOB with a birth defect themselves no 17. Recurrent pregnancy loss, or stillbirth  no  18. Any medications since LMP other than prenatal vitamins (include vitamins, supplements, OTC meds, drugs, alcohol)  no 19. Any other genetic/environmental exposure to discuss  no  Infection History:   1. Lives with someone with TB or TB exposed  no  2. Patient or partner has history of genital herpes  no 3. Rash or viral illness since LMP  no 4. History of STI (GC, CT, HPV, syphilis, HIV)  no 5. History of recent travel :  no  Other pertinent information:  no     Review of Systems:10 point review of systems negative unless otherwise noted in HPI  Past Medical History:  Patient Active Problem List   Diagnosis Date Noted   Supervision of normal pregnancy 06/26/2020    Clinic [facility] Prenatal Labs  Dating  Blood type: A/Positive/-- (06/07 1432)   Genetic Screen 1 Screen:    AFP:     Quad:     NIPS: Antibody:Negative (06/07 1432)  Anatomic [place]  Rubella: 3.24 (06/07 1432)  Varicella: @VZVIGG @  GTT Early: NA  Third trimester:  RPR: Non Reactive (06/07 1432)   Rhogam  HBsAg: Negative (06/07 1432)    Vaccines TDAP:                       Flu Shot: Covid: HIV: Non Reactive (06/07 1432)   Baby Food                                GBS:   GC/CT:  Contraception  Pap: 06/26/20  CBB     CS/VBAC Primary 2019   Support Person Husband Demarco        Condyloma acuminata 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 Pt bothered by them, so prescribed imiquimod cream today.      Past Surgical History:  Past Surgical History:  Procedure Laterality Date   CESAREAN SECTION N/A 12/20/2017    Procedure: CESAREAN SECTION;  Surgeon: Homero Fellers, MD;  Location: [facility] ORS;  Service: Obstetrics;  Laterality: N/A;    Gynecologic History: Patient's last menstrual period was 03/24/2020 (within weeks).  Obstetric History: G2P1001  Family History:  Family History  Problem Relation Age of Onset   Endometriosis Mother    Hypertension Mother    Hyperlipidemia Father    Diabetes Maternal Grandmother     Social History:  Social History   Socioeconomic History   Marital status: Single    Spouse name: Not on file   Number of children: 1   Years of education: 14   Highest education level: Associate degree: academic program  Occupational History   Not on file  Tobacco Use   Smoking status: Former    Packs/day: 0.25    Years: 1.00    Pack years: 0.25    Types: Cigarettes   Smokeless tobacco: Never  Vaping Use   Vaping Use: Never used  Substance and Sexual Activity   Alcohol use: Yes    Comment: occasionally   Drug use: No   Sexual activity: Yes    Birth control/protection: None  Other Topics Concern   Not on file  Social History Narrative   Not on file   Social Determinants of Health   Financial Resource Strain: Not on file  Food Insecurity: Not on file  Transportation Needs: Not on file  Physical Activity: Not on file  Stress: Not on file  Social Connections: Not on file  Intimate Partner Violence: Not At Risk   Fear of Current or Ex-Partner: No   Emotionally Abused: No   Physically Abused: No   Sexually Abused: No    Allergies:  No Known Allergies  Medications: Prior to Admission medications   Medication Sig Start Date End Date Taking? Authorizing Provider  fluticasone (FLONASE) 50 MCG/ACT nasal spray Place 1 spray into both nostrils 2 (two) times daily. Patient not taking: Reported on 07/02/2017 05/30/15 10/05/18  Olen Cordial, NP  norethindrone (MICRONOR,CAMILA,ERRIN) 0.35 MG tablet Take 1 tablet (0.35 mg total) by mouth daily. 12/23/17  07/03/19  Rexene Agent, CNM    Physical Exam Vitals: Blood pressure 100/70, weight 149 lb (67.6 kg), last menstrual period 03/24/2020.  General: NAD HEENT: normocephalic, anicteric Thyroid: no enlargement, no palpable nodules Pulmonary: No increased work of breathing, CTAB Cardiovascular: RRR, distal pulses 2+ Abdomen: NABS, soft, non-tender, non-distended.  Umbilicus without lesions.  No hepatomegaly, splenomegaly or masses palpable. No evidence of hernia  Genitourinary:  External: Normal external female genitalia.  Normal urethral meatus, normal  Bartholin's and Skene's glands.    Vagina: Normal vaginal mucosa, no evidence of prolapse.    Cervix: Grossly normal in appearance, no bleeding, no CMT  Uterus:  Non-enlarged, mobile, normal contour.    Adnexa: ovaries non-enlarged, no adnexal masses  Rectal: deferred Extremities: no edema, erythema, or tenderness Neurologic: Grossly intact Psychiatric: mood appropriate, affect full   The following were addressed during this visit:  Breastfeeding Education - Early initiation of breastfeeding    Comments: Keeps milk supply adequate, helps contract uterus and slow bleeding, and early milk is the perfect first food and is easy to digest.   - The importance of exclusive breastfeeding    Comments: Provides antibodies, Lower risk of breast and ovarian cancers, and type-2 diabetes,Helps your body recover, Reduced chance of SIDS.   - Risks of giving your baby anything other than breast milk if you are breastfeeding    Comments: Make the baby less content with breastfeeds, may make my baby more susceptible to illness, and may reduce my milk supply.   - The importance of early skin-to-skin contact    Comments:  Keeps baby warm and secure, helps keep baby's blood sugar up and breathing steady, easier to bond and breastfeed, and helps calm baby.  - Rooming-in on a 24-hour basis    Comments: Easier to learn baby's feeding cues, easier to  bond and get to know each other, and encourages milk production.   - Feeding on demand or baby-led feeding    Comments: Helps prevent breastfeeding complications, helps bring in good milk supply, prevents under or overfeeding, and helps baby feel content and satisfied   - Frequent feeding to help assure optimal milk production    Comments: Making a full supply of milk requires frequent removal of milk from breasts, infant will eat 8-12 times in 24 hours, if separated from infant use breast massage, hand expression and/ or pumping to remove milk from breasts.   - Effective positioning and attachment    Comments: Helps my baby to get enough breast milk, helps to produce an adequate milk supply, and helps prevent nipple pain and damage   - Exclusive breastfeeding for the first 6 months    Comments: Builds a healthy milk supply and keeps it up, protects baby from sickness and disease, and breastmilk has everything your baby needs for the first 6 months.  - Individualized Education    Comments: Contraindications to breastfeeding and other special medical conditions Patient has experience breastfeeding her first baby for 4 months    Assessment: 80 y.o. G2P1001 at [redacted]w[redacted]d by approximate LMP presenting to initiate prenatal care  Plan: 1) Avoid alcoholic beverages. 2) Patient encouraged not to smoke.  3) Discontinue the use of all non-medicinal drugs and chemicals.  4) Take prenatal vitamins daily.  5) Nutrition, food safety (fish, cheese advisories, and high nitrite foods) and exercise discussed. 6) Hospital and practice style discussed with cross coverage system.  7) Genetic Screening, such as with 1st Trimester Screening, cell free fetal DNA, AFP testing, and Ultrasound, as well as with amniocentesis and CVS as appropriate, is discussed with patient. At the conclusion of today's visit patient requested genetic testing 8) Patient is asked about travel to areas at risk for the Zika virus, and  counseled to avoid travel and exposure to mosquitoes or sexual partners who may have themselves been exposed to the virus. Testing is discussed, and will be ordered  as appropriate.  9) PAPtima, urine culture, NOB panel, sickle cell screen, Hep B/C done today 10) Return to clinic in 1 week for dating and ROB with MD 11) MaterniT 21 when 10+ weeks   Rod Can, [facility] 06/28/2020, 1:03 PM

## 2020-06-29 LAB — URINE CULTURE

## 2020-07-04 ENCOUNTER — Ambulatory Visit (INDEPENDENT_AMBULATORY_CARE_PROVIDER_SITE_OTHER): Payer: Medicaid Other | Admitting: Obstetrics and Gynecology

## 2020-07-04 ENCOUNTER — Other Ambulatory Visit: Payer: Self-pay

## 2020-07-04 VITALS — BP 121/80 | Wt 148.0 lb

## 2020-07-04 DIAGNOSIS — Z31438 Encounter for other genetic testing of female for procreative management: Secondary | ICD-10-CM

## 2020-07-04 DIAGNOSIS — Z3A10 10 weeks gestation of pregnancy: Secondary | ICD-10-CM

## 2020-07-04 DIAGNOSIS — Z369 Encounter for antenatal screening, unspecified: Secondary | ICD-10-CM

## 2020-07-04 DIAGNOSIS — Z3689 Encounter for other specified antenatal screening: Secondary | ICD-10-CM | POA: Diagnosis not present

## 2020-07-04 DIAGNOSIS — Z3482 Encounter for supervision of other normal pregnancy, second trimester: Secondary | ICD-10-CM

## 2020-07-04 DIAGNOSIS — Z1379 Encounter for other screening for genetic and chromosomal anomalies: Secondary | ICD-10-CM

## 2020-07-04 LAB — POCT URINALYSIS DIPSTICK OB
Glucose, UA: NEGATIVE
POC,PROTEIN,UA: NEGATIVE

## 2020-07-04 NOTE — Progress Notes (Signed)
    Routine Prenatal Care Visit  Subjective  Sharon Soto is a 30 y.o. G2P1001 at [redacted]w[redacted]d being seen today for ongoing prenatal care.  She is currently monitored for the following issues for this low-risk pregnancy and has Condyloma acuminata and Supervision of normal pregnancy on their problem list.  ----------------------------------------------------------------------------------- Patient reports no complaints.   Contractions: Not present. Vag. Bleeding: None.  Movement: Absent. Denies leaking of fluid.  ----------------------------------------------------------------------------------- The following portions of the patient's history were reviewed and updated as appropriate: allergies, current medications, past family history, past medical history, past social history, past surgical history and problem list. Problem list updated.   Objective  Blood pressure 121/80, weight 148 lb (67.1 kg), last menstrual period 03/24/2020. Pregravid weight 145 lb (65.8 kg) Total Weight Gain 3 lb (1.361 kg) Urinalysis:      Fetal Status: Fetal Heart Rate (bpm): 177   Movement: Absent     General:  Alert, oriented and cooperative. Patient is in no acute distress.  Skin: Skin is warm and dry. No rash noted.   Cardiovascular: Normal heart rate noted  Respiratory: Normal respiratory effort, no problems with respiration noted  Abdomen: Soft, gravid, appropriate for gestational age. Pain/Pressure: Absent     Pelvic:  Cervical exam deferred        Extremities: Normal range of motion.     ental Status: Normal mood and affect. Normal behavior. Normal judgment and thought content.     Assessment   30 y.o. G2P1001 at [redacted]w[redacted]d by  12/29/2020, by Last Menstrual Period presenting for routine prenatal visit  Plan   pregnancy2  Problems (from 02/25/20 to present)     Problem Noted Resolved   Supervision of normal pregnancy 06/26/2020 by Rod Can, CNM No   Overview Signed 06/28/2020 12:37 PM by Rod Can, [place] Prenatal Labs  Dating  Blood type: A/Positive/-- (06/07 1432)   Genetic Screen 1 Screen:    AFP:     Quad:     NIPS: Antibody:Negative (06/07 1432)  Anatomic [place]  Rubella: 3.24 (06/07 1432)  Varicella: @VZVIGG @  GTT Early: NA                Third trimester:  RPR: Non Reactive (06/07 1432)   Rhogam  HBsAg: Negative (06/07 1432)    Vaccines TDAP:                       Flu Shot: Covid: HIV: Non Reactive (06/07 1432)   Baby Food                                GBS:   GC/CT:  Contraception  Pap: 06/26/20  CBB     CS/VBAC Primary 2019   Support Person Husband Demarco                Gestational age appropriate obstetric precautions including but not limited to vaginal bleeding, contractions, leaking of fluid and fetal movement were reviewed in detail with the patient.    1) S<D on scan today EDD changed  2) Genetic testing - inheritest and MaterniT21 today  Return in about 4 weeks (around 08/01/2020) for ROB.  Malachy Mood, MD, [facility], [place]

## 2020-07-04 NOTE — Progress Notes (Signed)
ROB - dating scan, no concerns. RM 2

## 2020-07-04 NOTE — Progress Notes (Signed)
Dating Scan  Singleton viable IUP measuring [redacted]w[redacted]d CRL 3.62cm (3.67cm, 3.68cm, 3.52cm) consistent with [place] EDD of 01/26/2021.  This is not consistent with LMP dating of 12/29/2020  FHT 177 BPM YS 0.6cm ROV normal LOV normal Possible small fundal fibroid No free fluid  There is a viable singleton gestation.  The fetal biometry does not correlates with established dating and EDD changed to 01/26/2021. Detailed evaluation of the fetal anatomy is precluded by early gestational age.  It must be noted that a normal ultrasound particular at this early gestational age is unable to rule out fetal aneuploidy, risk of first trimester miscarriage, or anatomic birth defects.  Malachy Mood, MD, [facility], [facility] 07/04/2020, 1:33 PM

## 2020-07-11 ENCOUNTER — Telehealth: Payer: Self-pay

## 2020-07-11 NOTE — Telephone Encounter (Signed)
Pt calling for genetic test results.  [phone]  Adv they are not back yet; usually takes around two wks.

## 2020-07-14 LAB — INHERITEST CORE(CF97,SMA,FRAX)

## 2020-07-14 LAB — MATERNIT 21 PLUS CORE, BLOOD
Fetal Fraction: 7
Result (T21): NEGATIVE
Trisomy 13 (Patau syndrome): NEGATIVE
Trisomy 18 (Edwards syndrome): NEGATIVE
Trisomy 21 (Down syndrome): NEGATIVE

## 2020-07-25 ENCOUNTER — Encounter: Payer: Medicaid Other | Admitting: Advanced Practice Midwife

## 2020-08-01 ENCOUNTER — Ambulatory Visit (INDEPENDENT_AMBULATORY_CARE_PROVIDER_SITE_OTHER): Payer: Medicaid Other | Admitting: Obstetrics and Gynecology

## 2020-08-01 ENCOUNTER — Other Ambulatory Visit: Payer: Self-pay

## 2020-08-01 VITALS — BP 126/78 | Wt 146.0 lb

## 2020-08-01 DIAGNOSIS — Z363 Encounter for antenatal screening for malformations: Secondary | ICD-10-CM

## 2020-08-01 DIAGNOSIS — Z3A14 14 weeks gestation of pregnancy: Secondary | ICD-10-CM

## 2020-08-01 DIAGNOSIS — Z3482 Encounter for supervision of other normal pregnancy, second trimester: Secondary | ICD-10-CM

## 2020-08-01 LAB — POCT URINALYSIS DIPSTICK OB
Glucose, UA: NEGATIVE
POC,PROTEIN,UA: NEGATIVE

## 2020-08-01 NOTE — Progress Notes (Signed)
    Routine Prenatal Care Visit  Subjective  Sharon Soto is a 30 y.o. G2P1001 at [redacted]w[redacted]d being seen today for ongoing prenatal care.  She is currently monitored for the following issues for this low-risk pregnancy and has Condyloma acuminata and Supervision of normal pregnancy on their problem list.  ----------------------------------------------------------------------------------- Patient reports no complaints.   Contractions: Not present. Vag. Bleeding: None.  Movement: Absent. Denies leaking of fluid.  ----------------------------------------------------------------------------------- The following portions of the patient's history were reviewed and updated as appropriate: allergies, current medications, past family history, past medical history, past social history, past surgical history and problem list. Problem list updated.   Objective  Blood pressure 126/78, weight 146 lb (66.2 kg), last menstrual period 03/24/2020. Pregravid weight 145 lb (65.8 kg) Total Weight Gain 1 lb (0.454 kg) Urinalysis:      Fetal Status: Fetal Heart Rate (bpm): 160   Movement: Absent     General:  Alert, oriented and cooperative. Patient is in no acute distress.  Skin: Skin is warm and dry. No rash noted.   Cardiovascular: Normal heart rate noted  Respiratory: Normal respiratory effort, no problems with respiration noted  Abdomen: Soft, gravid, appropriate for gestational age. Pain/Pressure: Absent     Pelvic:  Cervical exam deferred        Extremities: Normal range of motion.     ental Status: Normal mood and affect. Normal behavior. Normal judgment and thought content.     Assessment   30 y.o. G2P1001 at [redacted]w[redacted]d by  01/26/2021, by Ultrasound presenting for routine prenatal visit  Plan   pregnancy2  Problems (from 02/25/20 to present)     Problem Noted Resolved   Supervision of normal pregnancy 06/26/2020 by Rod Can, CNM No   Overview Signed 06/28/2020 12:37 PM by Rod Can, [place] Prenatal Labs  Dating  Blood type: A/Positive/-- (06/07 1432)   Genetic Screen 1 Screen:    AFP:     Quad:     NIPS: Antibody:Negative (06/07 1432)  Anatomic [place]  Rubella: 3.24 (06/07 1432)  Varicella: @VZVIGG @  GTT Early: NA                Third trimester:  RPR: Non Reactive (06/07 1432)   Rhogam  HBsAg: Negative (06/07 1432)    Vaccines TDAP:                       Flu Shot: Covid: HIV: Non Reactive (06/07 1432)   Baby Food                                GBS:   GC/CT:  Contraception  Pap: 06/26/20  CBB     CS/VBAC Primary 2019   Support Person Husband Demarco                Gestational age appropriate obstetric precautions including but not limited to vaginal bleeding, contractions, leaking of fluid and fetal movement were reviewed in detail with the patient.   Ordered anatomy scan    Return in about 4 weeks (around 08/29/2020) for ROB.  Malachy Mood, MD, [facility], [facility] 08/01/2020, 2:43 PM

## 2020-08-01 NOTE — Progress Notes (Signed)
ROB - no concerns. RM 2

## 2020-08-23 ENCOUNTER — Other Ambulatory Visit: Payer: Self-pay | Admitting: Obstetrics and Gynecology

## 2020-08-23 MED ORDER — PROCHLORPERAZINE MALEATE 10 MG PO TABS: 10.0000 mg | ORAL_TABLET | Freq: Four times a day (QID) | ORAL | 2 refills | Status: DC | PRN

## 2020-08-27 ENCOUNTER — Ambulatory Visit (INDEPENDENT_AMBULATORY_CARE_PROVIDER_SITE_OTHER): Payer: Medicaid Other | Admitting: Obstetrics and Gynecology

## 2020-08-27 ENCOUNTER — Other Ambulatory Visit: Payer: Self-pay

## 2020-08-27 VITALS — BP 122/76 | Wt 148.0 lb

## 2020-08-27 DIAGNOSIS — Z3A18 18 weeks gestation of pregnancy: Secondary | ICD-10-CM

## 2020-08-27 DIAGNOSIS — Z3482 Encounter for supervision of other normal pregnancy, second trimester: Secondary | ICD-10-CM

## 2020-08-27 NOTE — Progress Notes (Signed)
ROB - no concerns. RM 4

## 2020-09-02 NOTE — Progress Notes (Signed)
    Routine Prenatal Care Visit  Subjective  Sharon Soto is a 30 y.o. G2P1001 at 48w2dbeing seen today for ongoing prenatal care.  She is currently monitored for the following issues for this low-risk pregnancy and has Condyloma acuminata and Supervision of normal pregnancy on their problem list.  ----------------------------------------------------------------------------------- Patient reports no complaints.   Contractions: Not present. Vag. Bleeding: None.  Movement: Present. Denies leaking of fluid.  ----------------------------------------------------------------------------------- The following portions of the patient's history were reviewed and updated as appropriate: allergies, current medications, past family history, past medical history, past social history, past surgical history and problem list. Problem list updated.   Objective  Blood pressure 122/76, weight 148 lb (67.1 kg), last menstrual period 03/24/2020. Pregravid weight 145 lb (65.8 kg) Total Weight Gain 3 lb (1.361 kg) Urinalysis:      Fetal Status: Fetal Heart Rate (bpm): 155   Movement: Present     General:  Alert, oriented and cooperative. Patient is in no acute distress.  Skin: Skin is warm and dry. No rash noted.   Cardiovascular: Normal heart rate noted  Respiratory: Normal respiratory effort, no problems with respiration noted  Abdomen: Soft, gravid, appropriate for gestational age. Pain/Pressure: Absent     Pelvic:  Cervical exam deferred        Extremities: Normal range of motion.     ental Status: Normal mood and affect. Normal behavior. Normal judgment and thought content.     Assessment   30y.o. G2P1001 at 183w2dy  01/26/2021, by Ultrasound presenting for routine prenatal visit  Plan   pregnancy2  Problems (from 02/25/20 to present)     Problem Noted Resolved   Supervision of normal pregnancy 06/26/2020 by GlRod CanCNM No   Overview Signed 06/28/2020 12:37 PM by GlRod CanCNLowndesbororenatal Labs  Dating  Blood type: A/Positive/-- (06/07 1432)   Genetic Screen 1 Screen:    AFP:     Quad:     NIPS: Antibody:Negative (06/07 1432)  Anatomic [place]ella: 3.24 (06/07 1432)  Varicella: '@VZVIGG'$ @  GTT Early: NA                Third trimester:  RPR: Non Reactive (06/07 1432)   Rhogam  HBsAg: Negative (06/07 1432)    Vaccines TDAP:                       Flu Shot: Covid: HIV: Non Reactive (06/07 1432)   Baby Food                                GBS:   GC/CT:  Contraception  Pap: 06/26/20  CBB     CS/VBAC Primary 2019   Support Person Husband Demarco               Gestational age appropriate obstetric precautions including but not limited to vaginal bleeding, contractions, leaking of fluid and fetal movement were reviewed in detail with the patient.    - anatomy scan pending  Return KEEP 8/23 ROB.  AnMalachy MoodMD, FALoura PardonB/GYN, [place]

## 2020-09-10 ENCOUNTER — Other Ambulatory Visit: Payer: Self-pay

## 2020-09-10 ENCOUNTER — Ambulatory Visit
Admission: RE | Admit: 2020-09-10 | Discharge: 2020-09-10 | Disposition: A | Discharge status: Home / Self Care | Payer: Medicaid Other | Source: Ambulatory Visit | Attending: Obstetrics and Gynecology | Admitting: Obstetrics and Gynecology

## 2020-09-10 DIAGNOSIS — Z363 Encounter for antenatal screening for malformations: Secondary | ICD-10-CM | POA: Insufficient documentation

## 2020-09-10 DIAGNOSIS — Z3482 Encounter for supervision of other normal pregnancy, second trimester: Secondary | ICD-10-CM | POA: Insufficient documentation

## 2020-09-11 ENCOUNTER — Ambulatory Visit (INDEPENDENT_AMBULATORY_CARE_PROVIDER_SITE_OTHER): Payer: Medicaid Other | Admitting: Obstetrics and Gynecology

## 2020-09-11 VITALS — BP 120/72 | Wt 150.0 lb

## 2020-09-11 DIAGNOSIS — Z3A20 20 weeks gestation of pregnancy: Secondary | ICD-10-CM

## 2020-09-11 DIAGNOSIS — Z3482 Encounter for supervision of other normal pregnancy, second trimester: Secondary | ICD-10-CM

## 2020-09-11 LAB — POCT URINALYSIS DIPSTICK OB
Glucose, UA: NEGATIVE
POC,PROTEIN,UA: NEGATIVE

## 2020-09-11 NOTE — Progress Notes (Signed)
ROB - Anatomy [facility]), no concerns. RM 4

## 2020-09-18 NOTE — Progress Notes (Signed)
Routine Prenatal Care Visit  Subjective  Sharon Soto is a 30 y.o. G2P1001 at 83w3dbeing seen today for ongoing prenatal care.  She is currently monitored for the following issues for this low-risk pregnancy and has Condyloma acuminata and Supervision of normal pregnancy on their problem list.  ----------------------------------------------------------------------------------- Patient reports no complaints.   Contractions: Not present. Vag. Bleeding: None.  Movement: Present. Denies leaking of fluid.  ----------------------------------------------------------------------------------- The following portions of the patient's history were reviewed and updated as appropriate: allergies, current medications, past family history, past medical history, past social history, past surgical history and problem list. Problem list updated.   Objective  Blood pressure 120/72, weight 150 lb (68 kg), last menstrual period 03/24/2020. Pregravid weight 145 lb (65.8 kg) Total Weight Gain 5 lb (2.268 kg) Urinalysis:      Fetal Status: Fetal Heart Rate (bpm): 145   Movement: Present     General:  Alert, oriented and cooperative. Patient is in no acute distress.  Skin: Skin is warm and dry. No rash noted.   Cardiovascular: Normal heart rate noted  Respiratory: Normal respiratory effort, no problems with respiration noted  Abdomen: Soft, gravid, appropriate for gestational age. Pain/Pressure: Absent     Pelvic:  Cervical exam deferred        Extremities: Normal range of motion.     ental Status: Normal mood and affect. Normal behavior. Normal judgment and thought content.   UKoreaOB Comp + 14 Wk  Result Date: 09/11/2020 CLINICAL DATA:  Second trimester pregnancy for fetal anatomy survey. EXAM: OBSTETRICAL ULTRASOUND >14 WKS FINDINGS: Number of Fetuses: 1 Heart Rate:  153 bpm Movement: Yes Presentation: Breech Previa: No Placental Location: Fundal Amniotic Fluid (Subjective): Within normal limits  Amniotic Fluid (Objective): Vertical pocket = 6.6cm FETAL BIOMETRY BPD: 4.5cm 19w 5d HC:   17.1cm 19w 5d AC:   14.9cm 20w 1d FL:   3.2cm 19w 6d Current Mean GA: 20w 0d UKoreaEDC: 01/28/2021 Assigned GA:  20w 2d Assigned EDC: 01/26/2021 FETAL ANATOMY Lateral Ventricles: Appears normal Thalami/CSP: Appears normal Posterior Fossa:  Appears normal Nuchal Region: Appears normal   NFT= N/A > 20 WKS Upper Lip: Appears normal Spine: Appears normal 4 Chamber Heart on Left: Appears normal LVOT: Appears normal RVOT: Appears normal Stomach on Left: Appears normal 3 Vessel Cord: Appears normal Cord Insertion site: Appears normal Kidneys: Appears normal Bladder: Appears normal Extremities: Appears normal Sex: Female Maternal Findings: Cervix:  4.9 cm TA IMPRESSION: Assigned GA currently 20 weeks 2 days.  Appropriate fetal growth. Unremarkable fetal anatomic survey.  No fetal anomalies identified. Electronically Signed   By: JMarlaine HindM.D.   On: 09/11/2020 12:33     Assessment   30y.o. G2P1001 at 236w3dy  01/26/2021, by Ultrasound presenting for routine prenatal visit  Plan   pregnancy2  Problems (from 02/25/20 to present)     Problem Noted Resolved   Supervision of normal pregnancy 06/26/2020 by GlRod CanCNM No   Overview Signed 06/28/2020 12:37 PM by GlRod CanCNGlascorenatal Labs  Dating 10 week USKorealood type: A/Positive/-- (06/07 1432)   Genetic Screen CF neg, Fragile-X neg, SMA neg NIPS: normal XY Antibody:Negative (06/07 1432)  Anatomic USKoreaomplete and normal Rubella: 3.24 (06/07 1432)  Varicella: Immune  GTT Early: NA                Third trimester:  RPR: Non Reactive (06/07 1432)   Rhogam  HBsAg:  Negative (06/07 1432)    Vaccines TDAP:                       Flu Shot: Covid: HIV: Non Reactive (06/07 1432)   Baby Food                                GBS:   GC/CT:neg/neg   Contraception  Pap: 06/26/20 NILM  CBB     CS/VBAC Primary 2019   Support Person Husband Demarco                Gestational age appropriate obstetric precautions including but not limited to vaginal bleeding, contractions, leaking of fluid and fetal movement were reviewed in detail with the patient.    Return in about 4 weeks (around 10/09/2020) for 4 week ROB, and 8 week ROB and 28 week labs.  Malachy Mood, MD, [facility], [place]

## 2020-10-08 ENCOUNTER — Other Ambulatory Visit: Payer: Self-pay

## 2020-10-08 ENCOUNTER — Ambulatory Visit (INDEPENDENT_AMBULATORY_CARE_PROVIDER_SITE_OTHER): Payer: Medicaid Other | Admitting: Obstetrics and Gynecology

## 2020-10-08 VITALS — BP 122/80 | Wt 153.0 lb

## 2020-10-08 DIAGNOSIS — O34219 Maternal care for unspecified type scar from previous cesarean delivery: Secondary | ICD-10-CM

## 2020-10-08 DIAGNOSIS — Z3A24 24 weeks gestation of pregnancy: Secondary | ICD-10-CM

## 2020-10-08 DIAGNOSIS — Z3482 Encounter for supervision of other normal pregnancy, second trimester: Secondary | ICD-10-CM

## 2020-10-08 LAB — POCT URINALYSIS DIPSTICK OB
Glucose, UA: NEGATIVE
POC,PROTEIN,UA: NEGATIVE

## 2020-10-08 NOTE — Progress Notes (Signed)
ROB - no concerns. RM 6

## 2020-10-08 NOTE — Progress Notes (Signed)
    Routine Prenatal Care Visit  Subjective  Sharon Soto is a 30 y.o. G2P1001 at 29w2dbeing seen today for ongoing prenatal care.  She is currently monitored for the following issues for this low-risk pregnancy and has Condyloma acuminata and Supervision of normal pregnancy on their problem list.  ----------------------------------------------------------------------------------- Patient reports no complaints.    .  .   . Denies leaking of fluid.  ----------------------------------------------------------------------------------- The following portions of the patient's history were reviewed and updated as appropriate: allergies, current medications, past family history, past medical history, past social history, past surgical history and problem list. Problem list updated.   Objective  Blood pressure 122/80, weight 153 lb (69.4 kg), last menstrual period 03/24/2020. Pregravid weight 145 lb (65.8 kg) Total Weight Gain 8 lb (3.629 kg) Urinalysis:      Fetal Status:           General:  Alert, oriented and cooperative. Patient is in no acute distress.  Skin: Skin is warm and dry. No rash noted.   Cardiovascular: Normal heart rate noted  Respiratory: Normal respiratory effort, no problems with respiration noted  Abdomen: Soft, gravid, appropriate for gestational age.       Pelvic:  Cervical exam deferred        Extremities: Normal range of motion.     ental Status: Normal mood and affect. Normal behavior. Normal judgment and thought content.     Assessment   30y.o. G2P1001 at 248w2dy  01/26/2021, by Ultrasound presenting for routine prenatal visit  Plan   pregnancy2  Problems (from 02/25/20 to present)     Problem Noted Resolved   Supervision of normal pregnancy 06/26/2020 by GlRod CanCNM No   Overview Addendum 09/18/2020  6:07 PM by StMalachy MoodMD    Clinic [facility] Prenatal Labs  Dating 10 week USKorealood type: A/Positive/-- (06/07 1432)   Genetic Screen CF  neg, Fragile-X neg, SMA neg NIPS: Normal XY Antibody:Negative (06/07 1432)  Anatomic USKoreaRubella: 3.24 (06/07 1432)  Varicella: Immune  GTT Early: NA                Third trimester:  RPR: Non Reactive (06/07 1432)   Rhogam  HBsAg: Negative (06/07 1432)    Vaccines TDAP:                       Flu Shot: Covid: HIV: Non Reactive (06/07 1432)   Baby Food                                GBS:   GC/CT: neg / neg  Contraception  Pap: 06/26/20 NILM  CBB     CS/VBAC Primary 2019   Support Person Husband Demarco               Gestational age appropriate obstetric precautions including but not limited to vaginal bleeding, contractions, leaking of fluid and fetal movement were reviewed in detail with the patient.    - [facility] - 28 week labs next visit  Return in about 4 weeks (around 11/05/2020) for ROB and 28 week labs.  AnMalachy MoodMD, FAHelenaB/GYN, [facility] 10/08/2020, 2:23 PM

## 2020-10-22 ENCOUNTER — Other Ambulatory Visit: Payer: Self-pay | Admitting: Obstetrics and Gynecology

## 2020-11-05 ENCOUNTER — Encounter: Payer: Medicaid Other | Admitting: Obstetrics and Gynecology

## 2020-11-05 ENCOUNTER — Other Ambulatory Visit: Payer: Medicaid Other

## 2020-11-06 ENCOUNTER — Encounter: Payer: Self-pay | Admitting: Obstetrics and Gynecology

## 2020-11-06 ENCOUNTER — Other Ambulatory Visit: Payer: Self-pay

## 2020-11-06 ENCOUNTER — Other Ambulatory Visit: Payer: Medicaid Other

## 2020-11-06 ENCOUNTER — Ambulatory Visit (INDEPENDENT_AMBULATORY_CARE_PROVIDER_SITE_OTHER): Payer: Medicaid Other | Admitting: Obstetrics and Gynecology

## 2020-11-06 VITALS — BP 104/70 | Ht 59.5 in | Wt 156.8 lb

## 2020-11-06 DIAGNOSIS — Z3482 Encounter for supervision of other normal pregnancy, second trimester: Secondary | ICD-10-CM

## 2020-11-06 DIAGNOSIS — Z3A28 28 weeks gestation of pregnancy: Secondary | ICD-10-CM | POA: Diagnosis not present

## 2020-11-06 LAB — POCT URINALYSIS DIPSTICK OB
Glucose, UA: NEGATIVE
POC,PROTEIN,UA: NEGATIVE

## 2020-11-06 NOTE — Progress Notes (Signed)
Routine Prenatal Care Visit  Subjective  Sharon Soto is a 30 y.o. G2P1001 at [redacted]w[redacted]d being seen today for ongoing prenatal care.  She is currently monitored for the following issues for this low-risk pregnancy and has Condyloma acuminata; Supervision of normal pregnancy; and History of cesarean delivery, currently pregnant on their problem list.  ----------------------------------------------------------------------------------- Patient reports  she has been feeling well. She is considering delivery at [facility] . She is considering TOLAC versus repeat cesarean section.   Contractions: Not present. Vag. Bleeding: None.  Movement: Present. Denies leaking of fluid.  ----------------------------------------------------------------------------------- The following portions of the patient's history were reviewed and updated as appropriate: allergies, current medications, past family history, past medical history, past social history, past surgical history and problem list. Problem list updated.   Objective  Blood pressure 104/70, height 4' 11.5" (1.511 m), weight 156 lb 12.8 oz (71.1 kg), last menstrual period 03/24/2020. Pregravid weight 145 lb (65.8 kg) Total Weight Gain 11 lb 12.8 oz (5.352 kg) Urinalysis:      Fetal Status: Fetal Heart Rate (bpm): 154 Fundal Height: 28 cm Movement: Present     General:  Alert, oriented and cooperative. Patient is in no acute distress.  Skin: Skin is warm and dry. No rash noted.   Cardiovascular: Normal heart rate noted  Respiratory: Normal respiratory effort, no problems with respiration noted  Abdomen: Soft, gravid, appropriate for gestational age. Pain/Pressure: Absent     Pelvic:  Cervical exam deferred        Extremities: Normal range of motion.  Edema: None  Mental Status: Normal mood and affect. Normal behavior. Normal judgment and thought content.     Assessment   30 y.o. G2P1001 at [redacted]w[redacted]d by  01/26/2021, by Ultrasound presenting for  routine prenatal visit  Plan   pregnancy2  Problems (from 02/25/20 to present)     Problem Noted Resolved   History of cesarean delivery, currently pregnant 10/08/2020 by Malachy Mood, MD No   Supervision of normal pregnancy 06/26/2020 by Rod Can, CNM No   Overview Addendum 09/18/2020  6:07 PM by Malachy Mood, MD    [facility]inic [facility] Prenatal Labs  Dating 10 week [place] Blood type: A/Positive/-- (06/07 1432)   Genetic Screen CF neg, Fragile-X neg, SMA neg NIPS: Normal XY Antibody:Negative (06/07 1432)  Anatomic [place] complete Rubella: 3.24 (06/07 1432)  Varicella: Immune  GTT Early: NA                Third trimester:  RPR: Non Reactive (06/07 1432)   Rhogam Not needed HBsAg: Negative (06/07 1432)    Vaccines TDAP:                       Flu Shot: Covid: HIV: Non Reactive (06/07 1432)   Baby Food                                GBS:   GC/CT: neg / neg  Contraception  Pap: 06/26/20 NILM  CBB     CS/VBAC Primary 2019   Support Person Husband Demarco              Provided patient with information regarding TOLAC. Can continue conversation at next visit depending on patients decisions regarding transfer.  28 week labs today.   Gestational age appropriate obstetric precautions including but not limited to vaginal bleeding, contractions, leaking of fluid and fetal movement were reviewed  in detail with the patient.    Return in about 2 weeks (around 11/20/2020) for ROB .  Homero Fellers MD [facility], [facility] 11/06/2020, 9:48 AM

## 2020-11-06 NOTE — Patient Instructions (Signed)

## 2020-11-07 LAB — 28 WEEK RH+PANEL
Basophils Absolute: 0 10*3/uL (ref 0.0–0.2)
Basos: 1 %
EOS (ABSOLUTE): 0.3 10*3/uL (ref 0.0–0.4)
Eos: 3 %
Gestational Diabetes Screen: 124 mg/dL (ref 70–139)
HIV Screen 4th Generation wRfx: NONREACTIVE
Hematocrit: 31.8 % — ABNORMAL LOW (ref 34.0–46.6)
Hemoglobin: 10.7 g/dL — ABNORMAL LOW (ref 11.1–15.9)
Immature Grans (Abs): 0.2 10*3/uL — ABNORMAL HIGH (ref 0.0–0.1)
Immature Granulocytes: 2 %
Lymphocytes Absolute: 2.6 10*3/uL (ref 0.7–3.1)
Lymphs: 30 %
MCH: 28.6 pg (ref 26.6–33.0)
MCHC: 33.6 g/dL (ref 31.5–35.7)
MCV: 85 fL (ref 79–97)
Monocytes Absolute: 0.7 10*3/uL (ref 0.1–0.9)
Monocytes: 8 %
Neutrophils Absolute: 4.7 10*3/uL (ref 1.4–7.0)
Neutrophils: 56 %
Platelets: 259 10*3/uL (ref 150–450)
RBC: 3.74 x10E6/uL — ABNORMAL LOW (ref 3.77–5.28)
RDW: 12.1 % (ref 11.7–15.4)
RPR Ser Ql: NONREACTIVE
WBC: 8.4 10*3/uL (ref 3.4–10.8)

## 2020-11-20 ENCOUNTER — Encounter: Payer: Medicaid Other | Admitting: Obstetrics and Gynecology

## 2021-12-27 IMAGING — US US OB COMP +14 WK
1 series · 13 of 28 positions shown · non-contrast
Comparison: none

CLINICAL DATA: Second trimester pregnancy for fetal anatomy survey.

EXAM:
OBSTETRICAL ULTRASOUND >14 WKS

[Series 1: us ob comp + 14 wk · 90 acquisitions, 13 frames shown]
[im 4/90]
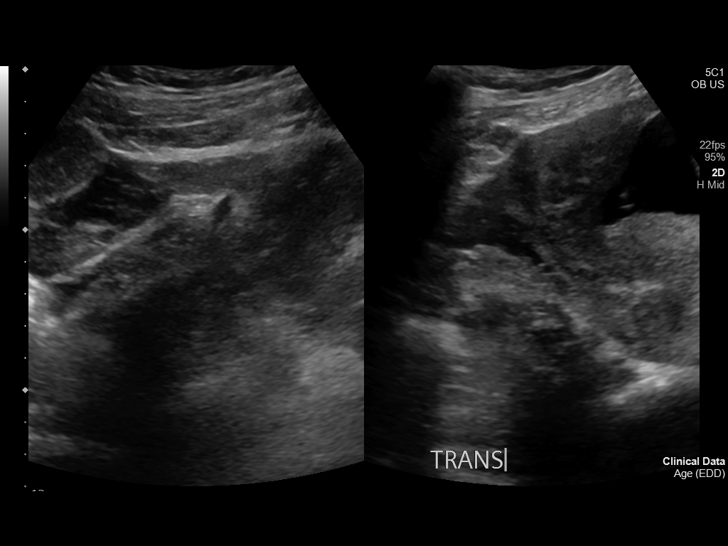
[im 10/90]
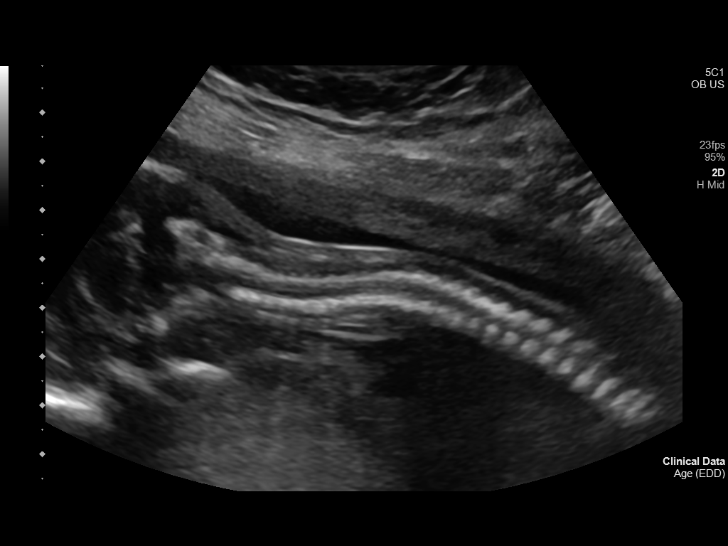
[im 17/90]
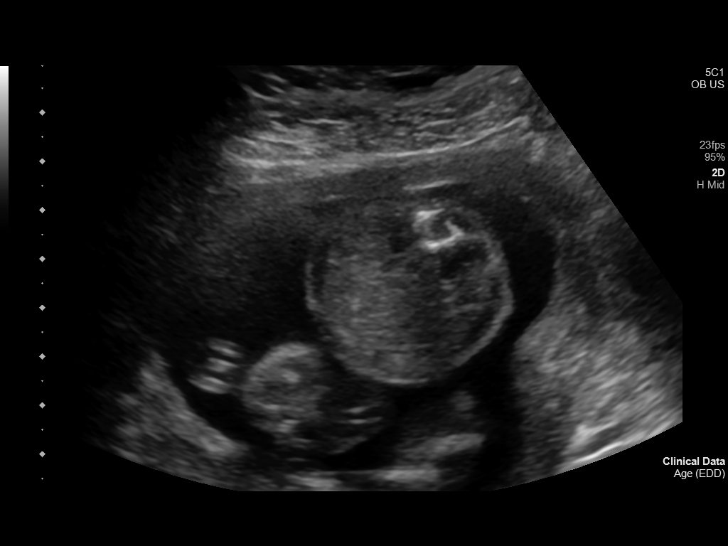
[im 24/90]
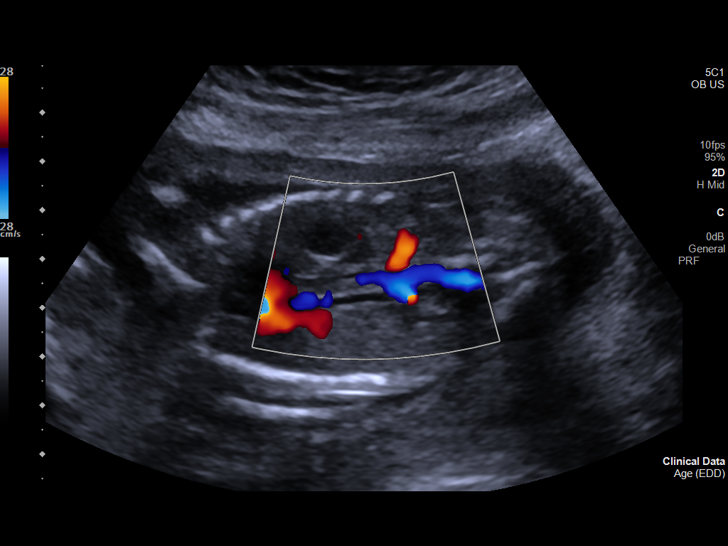
[im 30/90]
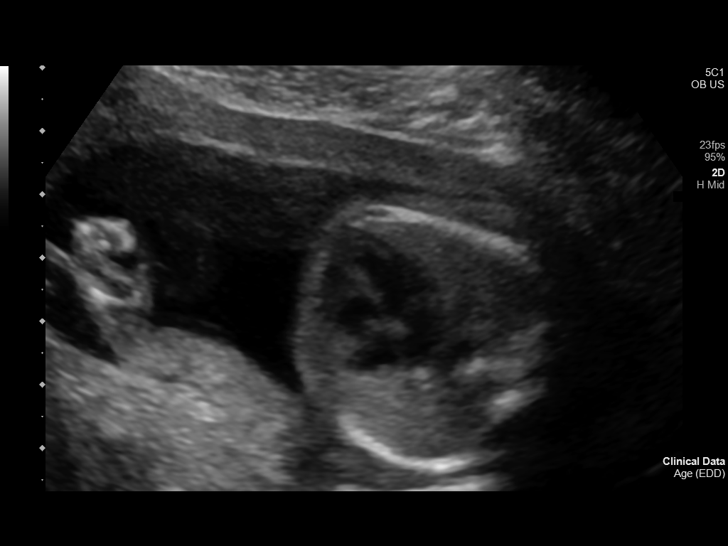
[im 37/90]
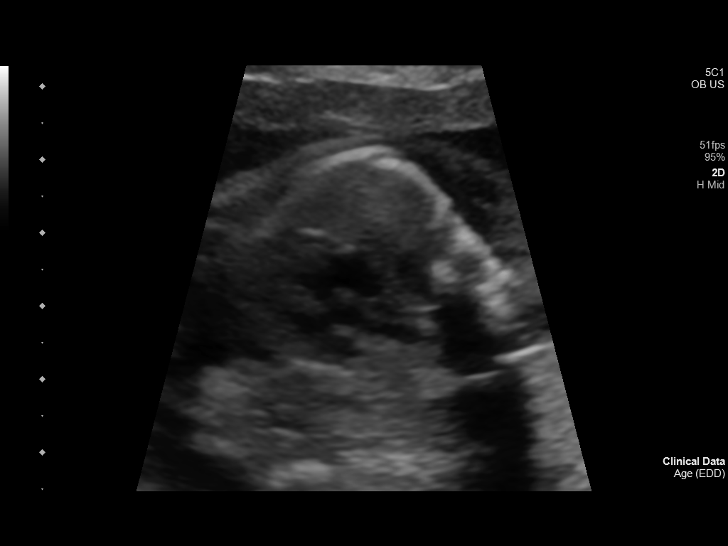
[im 47/90]
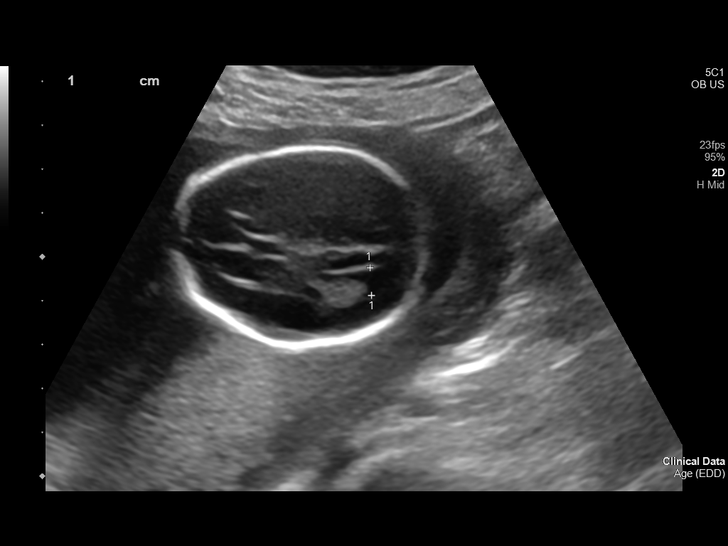
[im 53/90]
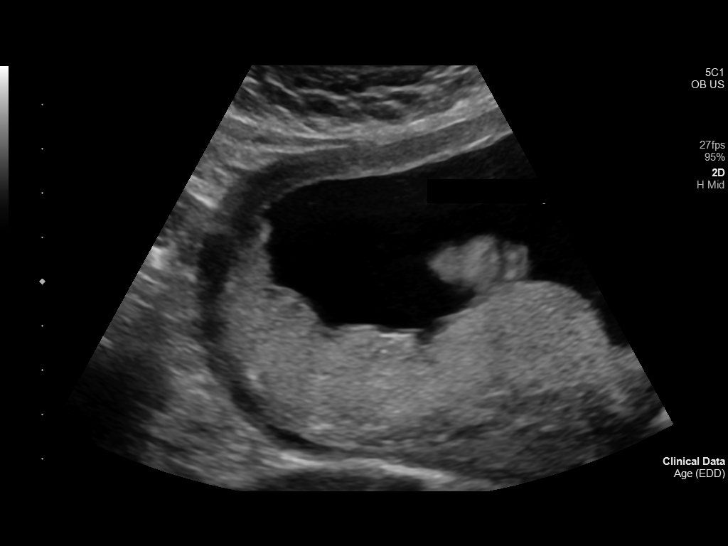
[im 60/90]
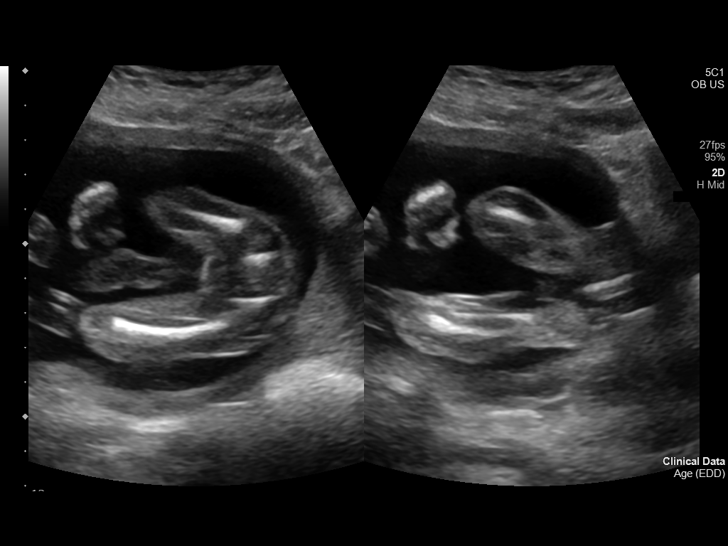
[im 66/90]
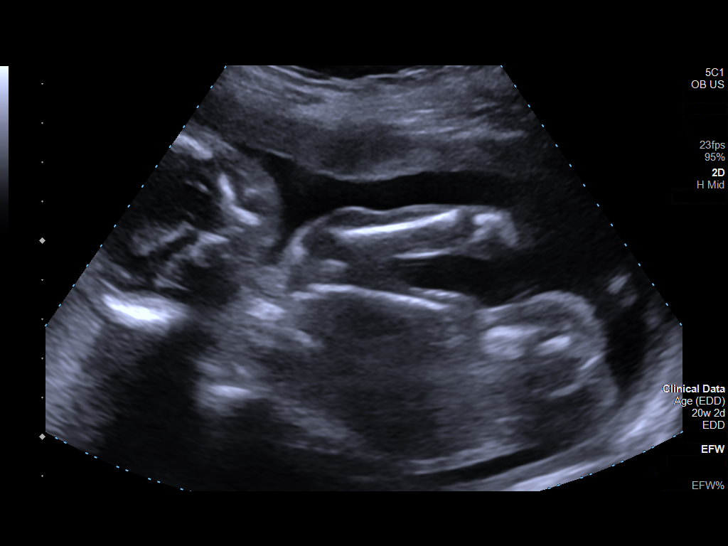
[im 73/90]
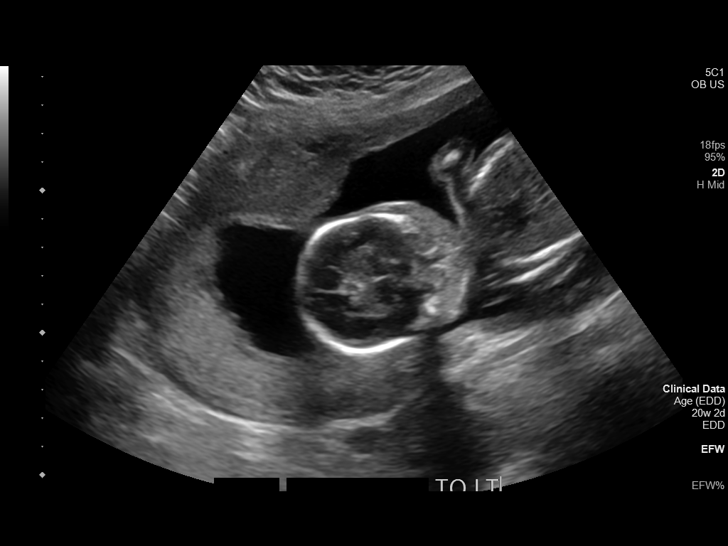
[im 80/90]
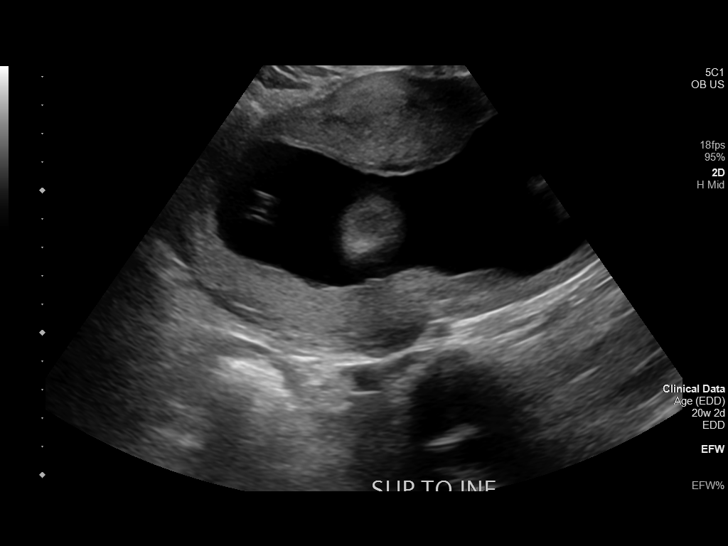
[im 86/90]
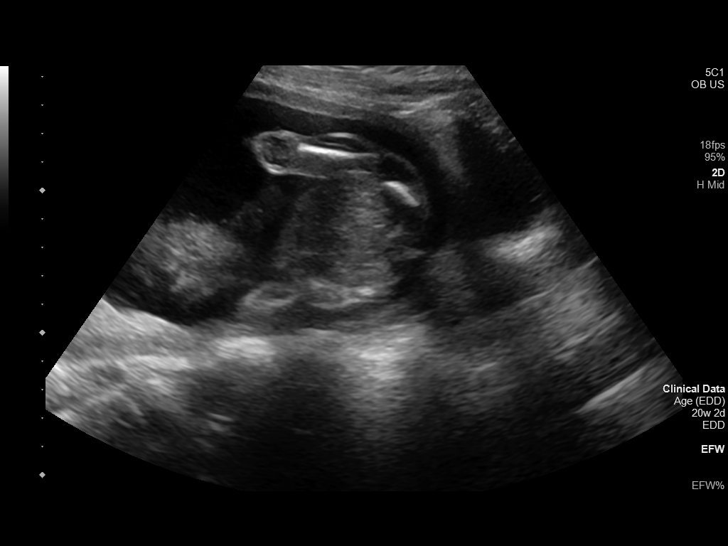

[13 of 28 positions shown; findings below may reference images not displayed]

FINDINGS: Number of Fetuses: 1

Heart Rate:  153 bpm

Movement: Yes

Presentation: Breech

Previa: No

Placental Location: Fundal

Amniotic Fluid (Subjective): Within normal limits

Amniotic Fluid (Objective):

Vertical pocket = 6.6cm

FETAL BIOMETRY

BPD: 4.5cm 19w 5d

HC:   17.1cm 19w 5d

AC:   14.9cm 20w 1d

FL:   3.2cm 19w 6d

Current Mean GA: 20w 0d US EDC: 01/28/2021

Assigned GA:  20w 2d Assigned EDC: 01/26/2021

FETAL ANATOMY

Lateral Ventricles: Appears normal

Thalami/CSP: Appears normal

Posterior Fossa:  Appears normal

Nuchal Region: Appears normal   NFT= N/A > 20 WKS

Upper Lip: Appears normal

Spine: Appears normal

4 Chamber Heart on Left: Appears normal

LVOT: Appears normal

RVOT: Appears normal

Stomach on Left: Appears normal

3 Vessel Cord: Appears normal

Cord Insertion site: Appears normal

Kidneys: Appears normal

Bladder: Appears normal

Extremities: Appears normal

Sex: Male

Maternal Findings:

Cervix:  4.9 cm TA
IMPRESSION: Assigned GA currently 20 weeks 2 days.  Appropriate fetal growth.

Unremarkable fetal anatomic survey.  No fetal anomalies identified.

## 2022-04-01 ENCOUNTER — Ambulatory Visit: Payer: Medicaid Other | Admitting: Family Medicine

## 2023-05-25 ENCOUNTER — Ambulatory Visit
Admission: EM | Admit: 2023-05-25 | Discharge: 2023-05-25 | Disposition: A | Discharge status: Home / Self Care | Attending: Emergency Medicine | Admitting: Emergency Medicine

## 2023-05-25 ENCOUNTER — Encounter: Payer: Self-pay | Admitting: Emergency Medicine

## 2023-05-25 DIAGNOSIS — J069 Acute upper respiratory infection, unspecified: Secondary | ICD-10-CM | POA: Insufficient documentation

## 2023-05-25 DIAGNOSIS — Z20822 Contact with and (suspected) exposure to covid-19: Secondary | ICD-10-CM | POA: Diagnosis present

## 2023-05-25 LAB — RESP PANEL BY RT-PCR (FLU A&B, COVID) ARPGX2
Influenza A by PCR: NEGATIVE
Influenza B by PCR: NEGATIVE
SARS Coronavirus 2 by RT PCR: NEGATIVE

## 2023-05-25 MED ORDER — IBUPROFEN 600 MG PO TABS: 600.0000 mg | ORAL_TABLET | Freq: Three times a day (TID) | ORAL | 0 refills | Status: DC | PRN

## 2023-05-25 MED ORDER — FLUTICASONE PROPIONATE 50 MCG/ACT NA SUSP: 2.0000 | Freq: Every day | NASAL | 0 refills | Status: DC

## 2023-05-25 NOTE — ED Notes (Signed)
 Called pt x2 unable to reach x2 to inform pt that room is available for her.

## 2023-05-25 NOTE — ED Provider Notes (Signed)
 HPI  SUBJECTIVE:  Sharon Soto is a 33 y.o. female who presents with nasal congestion, malaise, body aches, headaches, rhinorrhea, sinus pain and pressure, postnasal drip, sore throat and a slight cough starting yesterday.  No fevers, facial swelling, upper dental pain, wheezing, shortness of breath, nausea, vomiting, diarrhea, abdominal pain.  She was able to sleep last night without waking up coughing.  No antipyretic in the past 6 hours.  She has tried resting and water with improvement in her symptoms.  No aggravating factors.  She has no past medical history.  LMP: 4/18.  Denies possibility of being pregnant.  PCP: None.    Past Medical History:  Diagnosis Date   Condyloma acuminata    Uterine fibroid during pregnancy, antepartum     Past Surgical History:  Procedure Laterality Date   CESAREAN SECTION N/A 12/20/2017   Procedure: CESAREAN SECTION;  Surgeon: Heron Lord, MD;  Location: [facility] ORS;  Service: Obstetrics;  Laterality: N/A;    Family History  Problem Relation Age of Onset   Endometriosis Mother    Hypertension Mother    Hyperlipidemia Father    Diabetes Maternal Grandmother     Social History   Tobacco Use   Smoking status: Former    Current packs/day: 0.25    Average packs/day: 0.3 packs/day for 1 year (0.3 ttl pk-yrs)    Types: Cigarettes   Smokeless tobacco: Never  Vaping Use   Vaping status: Never Used  Substance Use Topics   Alcohol use: Yes    Comment: occasionally   Drug use: No    No current facility-administered medications for this encounter.  Current Outpatient Medications:    fluticasone  (FLONASE ) 50 MCG/ACT nasal spray, Place 2 sprays into both nostrils daily., Disp: 16 g, Rfl: 0   ibuprofen  (ADVIL ) 600 MG tablet, Take 1 tablet (600 mg total) by mouth every 8 (eight) hours as needed., Disp: 30 tablet, Rfl: 0  No Known Allergies   ROS  As noted in HPI.   Physical Exam  BP 109/73 (BP Location: Left Arm)   Pulse 80    Temp 98.8 F (37.1 C) (Oral)   Resp 16   LMP 05/08/2023   SpO2 99%   Constitutional: Well developed, well nourished, no acute distress Eyes: PERRL, EOMI, conjunctiva normal bilaterally HENT: Normocephalic, atraumatic,mucus membranes moist.  Purulent nasal congestion.  Erythematous, swollen turbinates.  No maxillary, frontal sinus tenderness.  Normal oropharynx.  No postnasal drip. Neck: Shotty cervical lymphadenopathy Respiratory: Clear to auscultation bilaterally, no rales, no wheezing, no rhonchi Cardiovascular: Normal rate and rhythm, no murmurs, no gallops, no rubs GI: Nondistended skin: No rash, skin intact Musculoskeletal: No deformities Neurologic: Alert & oriented x 3, CN III-XII grossly intact, no motor deficits, sensation grossly intact Psychiatric: Speech and behavior appropriate   ED Course   Medications - No data to display  Orders Placed This Encounter  Procedures   Resp Panel by RT-PCR (Flu A&B, Covid) Anterior Nasal Swab    Standing Status:   Standing    Number of Occurrences:   1   Results for orders placed or performed during the hospital encounter of 05/25/23 (from the past 24 hours)  Resp Panel by RT-PCR (Flu A&B, Covid) Anterior Nasal Swab     Status: None   Collection Time: 05/25/23 10:49 AM   Specimen: Anterior Nasal Swab  Result Value Ref Range   SARS Coronavirus 2 by RT PCR NEGATIVE NEGATIVE   Influenza A by PCR NEGATIVE NEGATIVE  Influenza B by PCR NEGATIVE NEGATIVE   No results found.  ED Clinical Impression  1. Upper respiratory tract infection, unspecified type   2. Lab test negative for COVID-19 virus      ED Assessment/Plan    COVID, flu negative.  Discussed with patient while in department.  Presentation consistent with an upper respiratory infection.  There are no clear indications for antibiotics.  No clear evidence of bacterial sinusitis.  Home with Flonase , saline nasal occasion, Tylenol /ibuprofen , Mucinex D and a work note.   She declined assistance in finding a PCP or a primary care list.  Discussed labs, MDM, treatment plan, and plan for follow-up with patient  patient agrees with plan.   Meds ordered this encounter  Medications   fluticasone  (FLONASE ) 50 MCG/ACT nasal spray    Sig: Place 2 sprays into both nostrils daily.    Dispense:  16 g    Refill:  0   ibuprofen  (ADVIL ) 600 MG tablet    Sig: Take 1 tablet (600 mg total) by mouth every 8 (eight) hours as needed.    Dispense:  30 tablet    Refill:  0      *This clinic note was created using Scientist, clinical (histocompatibility and immunogenetics). Therefore, there may be occasional mistakes despite careful proofreading. ?    Ethlyn Herd, MD 05/25/23 1627

## 2023-05-25 NOTE — Discharge Instructions (Signed)
 Your COVID and flu testing are negative.  Start Mucinex-D to keep the mucous thin and to decongest you.   You may take 600 mg of motrin  with 1000 mg of tylenol  up to 3-4 times a day as needed for pain. This is an effective combination for pain.  Most sinus infections are viral and do not need antibiotics unless you have a high fever, have had this for 10 days, or you get better and then get sick again. Use a NeilMed sinus rinse with distilled water as often as you want to to reduce nasal congestion. Follow the directions on the box.  Flonase  will also help with your nasal congestion.  Go to www.goodrx.com to look up your medications. This will give you a list of where you can find your prescriptions at the most affordable prices. Or you can ask the pharmacist what the cash price is. This is frequently cheaper than going through insurance.

## 2023-05-25 NOTE — ED Triage Notes (Signed)
 Pt presents with congestion, chills and headache since yesterday. Pt has not taken anything for her symptoms.

## 2023-05-25 NOTE — ED Notes (Signed)
 Called pt & lvm to inform pt that room is available for her.

## 2023-09-06 ENCOUNTER — Ambulatory Visit
Admission: EM | Admit: 2023-09-06 | Discharge: 2023-09-06 | Disposition: A | Discharge status: Home / Self Care | Attending: Emergency Medicine | Admitting: Emergency Medicine

## 2023-09-06 ENCOUNTER — Ambulatory Visit: Payer: Self-pay | Admitting: Emergency Medicine

## 2023-09-06 DIAGNOSIS — J0100 Acute maxillary sinusitis, unspecified: Secondary | ICD-10-CM | POA: Diagnosis present

## 2023-09-06 DIAGNOSIS — J020 Streptococcal pharyngitis: Secondary | ICD-10-CM | POA: Insufficient documentation

## 2023-09-06 DIAGNOSIS — Z20822 Contact with and (suspected) exposure to covid-19: Secondary | ICD-10-CM | POA: Insufficient documentation

## 2023-09-06 LAB — RESP PANEL BY RT-PCR (FLU A&B, COVID) ARPGX2
Influenza A by PCR: NEGATIVE
Influenza B by PCR: NEGATIVE
SARS Coronavirus 2 by RT PCR: NEGATIVE

## 2023-09-06 LAB — GROUP A STREP BY PCR: Group A Strep by PCR: DETECTED — AB

## 2023-09-06 MED ORDER — IBUPROFEN 600 MG PO TABS: 600.0000 mg | ORAL_TABLET | Freq: Three times a day (TID) | ORAL | 0 refills | Status: AC | PRN

## 2023-09-06 MED ORDER — AMOXICILLIN-POT CLAVULANATE 875-125 MG PO TABS
1.0000 | ORAL_TABLET | Freq: Two times a day (BID) | ORAL | 0 refills | Status: AC
Start: 2023-09-06 — End: 2023-09-16

## 2023-09-06 MED ORDER — AMOXICILLIN-POT CLAVULANATE 875-125 MG PO TABS: 1.0000 | ORAL_TABLET | Freq: Two times a day (BID) | ORAL | 0 refills | Status: DC

## 2023-09-06 MED ORDER — FLUTICASONE PROPIONATE 50 MCG/ACT NA SUSP: 2.0000 | Freq: Every day | NASAL | 0 refills | Status: AC

## 2023-09-06 MED ORDER — PROMETHAZINE-DM 6.25-15 MG/5ML PO SYRP: 5.0000 mL | ORAL_SOLUTION | Freq: Four times a day (QID) | ORAL | 0 refills | Status: AC | PRN

## 2023-09-06 NOTE — Discharge Instructions (Addendum)
 We will contact you if and only if your COVID, flu or strep come back positive.  Start Mucinex-D to keep the mucous thin and to decongest you.   You may take 600 mg of motrin  with 1000 mg of tylenol  up to 3-4 times a day as needed for pain. This is an effective combination for pain.  Finish the Augmentin , even if you feel better for your sinus infection.  Use a NeilMed sinus rinse with distilled water as often as you want to to reduce nasal congestion. Follow the directions on the box.  Flonase  will help with the nasal congestion as well.  Promethazine  DM for cough.  Here is a list of primary care providers who are taking new patients:  [facility] [place] Dr. Selinda Ku (sports medicine) Dr. Harlene Duos Rolan Hoyle, PA Dr. Mackey Ny Dr. Harlene Saddler [address] [place] [address] [phone]  [facility] at [facility] [address] [place], [place] [address] [phone]  [facility] [place] [address] [place] [address]  [phone]  [facility] [address] [place], [place] [address] [phone]  [facility] [address] [place]  [phone] [place], [place] [address]  Here are clinics/ other resources who will see you if you do not have insurance. Some have certain criteria that you must meet. Call them and find out what they are:  [facility]: [address] [place], [place] [address] Phone: [phone] Hours: First and Third Saturdays of each Month, 9 a.m. - 1 p.m.  [facility]: [address], [address] [place], [place] [address] Phone: [phone] Hours: Tuesday, 4 p.m. - 8 p.m. Thursday, 1 p.m. - 8 p.m. Wednesday, 9 a.m. - [facility] [address] [place], [place] [address] Phone: [phone] Pharmacy Phone Number: [phone] Dental Phone Number: [phone] [facility] Insurance Help: [phone]  Dental Hours: Monday - Thursday, 8 a.m. - 6 p.m.  [facility] [address] [place], [place] [address] Phone: [phone] Pharmacy Phone Number: [phone] [facility] Insurance Help: [phone]  [facility] [address] [place], [place] [address] Phone: [phone] Pharmacy Phone Number: [phone] [facility] Insurance Help: [phone]  [facility] [address] [place], [place] [address] Phone: [phone] [facility] Insurance Help: [phone]   [facility] [address] [place], [place] [address] Phone: [phone]  Go to www.goodrx.com  or www.costplusdrugs.com to look up your medications. This will give you a list of where you can find your prescriptions at the most affordable prices. Or ask the pharmacist what the cash price is, or if they have any other discount programs available to help make your medication more affordable. This can be less expensive than what you would pay with insurance.

## 2023-09-06 NOTE — ED Provider Notes (Signed)
 HPI  SUBJECTIVE:  Patient reports sore throat,  nasal congestion, rhinorrhea, postnasal drip, cough productive of yellow sputum, headache, sinus pain and pressure, facial swelling and upper dental pain starting 2 to 3 days ago.  No fevers, body aches, wheezing, chest pain, shortness of breath, nausea, vomiting, diarrhea, abdominal pain.  She is able to sleep at night without waking up coughing.  No antibiotics in the past month.  No antipyretic in the past 6 hours.  No known COVID, flu, strep exposure.  She got 2 doses of COVID-vaccine and last year's flu vaccine.  She tried honey/tea in the face steamer with improvement in her symptoms.  Her sinus pain and pressure is worse with lying down.  She has no past medical history.  LMP: 3 days ago.  Denies possibility being pregnant.    Past Medical History:  Diagnosis Date   Condyloma acuminata    Uterine fibroid during pregnancy, antepartum     Past Surgical History:  Procedure Laterality Date   CESAREAN SECTION N/A 12/20/2017   Procedure: CESAREAN SECTION;  Surgeon: Victor Claudell SAUNDERS, MD;  Location: [facility];  Service: Obstetrics;  Laterality: N/A;    Family History  Problem Relation Age of Onset   Endometriosis Mother    Hypertension Mother    Hyperlipidemia Father    Diabetes Maternal Grandmother     Social History   Tobacco Use   Smoking status: Former    Current packs/day: 0.25    Average packs/day: 0.3 packs/day for 1 year (0.3 ttl pk-yrs)    Types: Cigarettes   Smokeless tobacco: Never  Vaping Use   Vaping status: Never Used  Substance Use Topics   Alcohol use: Yes    Comment: occasionally   Drug use: No    No current facility-administered medications for this encounter.  Current Outpatient Medications:    fluticasone  (FLONASE ) 50 MCG/ACT nasal spray, Place 2 sprays into both nostrils daily., Disp: 16 g, Rfl: 0   ibuprofen  (ADVIL ) 600 MG tablet, Take 1 tablet (600 mg total) by mouth every 8 (eight) hours as  needed., Disp: 30 tablet, Rfl: 0   promethazine -dextromethorphan (PROMETHAZINE -DM) 6.25-15 MG/5ML syrup, Take 5 mLs by mouth 4 (four) times daily as needed for cough., Disp: 118 mL, Rfl: 0   amoxicillin -clavulanate (AUGMENTIN ) 875-125 MG tablet, Take 1 tablet by mouth every 12 (twelve) hours for 10 days., Disp: 20 tablet, Rfl: 0  No Known Allergies   ROS  As noted in HPI.   Physical Exam  BP 124/83 (BP Location: Left Arm)   Pulse (!) 101   Temp 99.1 F (37.3 C) (Oral)   Ht 5' (1.524 m)   Wt 68 kg   LMP 09/03/2023   SpO2 97%   BMI 29.29 kg/m   Constitutional: Well developed, well nourished, no acute distress.  Occasional cough. Eyes:  EOMI, conjunctiva normal bilaterally HENT: Normocephalic, atraumatic,mucus membranes moist.  Purulent nasal congestion.  Erythematous, swollen turbinates.  Positive maxillary sinus tenderness.  No frontal sinus tenderness.  Normal tonsils without exudates.  Normal oropharynx.  Uvula midline.  No postnasal drip.   No neck stiffness.  Normal voice.  No drooling, trismus. Respiratory: Normal inspiratory effort, good air movement.  Positive anterior, lateral chest wall tenderness. Cardiovascular: Regular tachycardia, no murmurs, rubs, gallops GI: Nondistended skin: No rash, skin intact Lymph: Positive anterior cervical LN.  No posterior cervical lymphadenopathy. Musculoskeletal: no deformities Neurologic: Alert & oriented x 3, no focal neuro deficits Psychiatric: Speech and behavior appropriate.  ED Course  Medications - No data to display  Orders Placed This Encounter  Procedures   Group A Strep by PCR    Standing Status:   Standing    Number of Occurrences:   1   Resp Panel by RT-PCR (Flu A&B, Covid) Anterior Nasal Swab    Standing Status:   Standing    Number of Occurrences:   1    Results for orders placed or performed during the hospital encounter of 09/06/23 (from the past 24 hours)  Group A Strep by PCR     Status: Abnormal    Collection Time: 09/06/23 12:41 PM   Specimen: Throat; Sterile Swab  Result Value Ref Range   Group A Strep by PCR DETECTED (A) NOT DETECTED  Resp Panel by RT-PCR (Flu A&B, Covid) Anterior Nasal Swab     Status: None   Collection Time: 09/06/23 12:41 PM   Specimen: Anterior Nasal Swab  Result Value Ref Range   SARS Coronavirus 2 by RT PCR NEGATIVE NEGATIVE   Influenza A by PCR NEGATIVE NEGATIVE   Influenza B by PCR NEGATIVE NEGATIVE   No results found.  ED Clinical Impression  1. Acute non-recurrent maxillary sinusitis   2. Encounter for laboratory testing for COVID-19 virus   3. Strep pharyngitis      ED Assessment/Plan    Patient presents with acute illness with systemic symptoms of tachycardia.  Will contact patient [phone] if COVID, flu or strep, are positive.  Presentation consistent with a sinusitis.  She qualifies for antibiotics due to severe symptoms of facial swelling and upper dental pain.  Home with Mucinex D, saline nasal irrigation, Augmentin  875 mg p.o. twice daily for 7 days, Tylenol  combined with ibuprofen  3-4 times a day as needed for pain, Promethazine  DM if the cough becomes bothersome.  Will provide primary care list for routine care.  Strep PCR positive.  Extending Augmentin  out to 10 days.  E-prescribed this to pharmacy on record.  Sent patient MyChart note informing her of extended prescription, and will also have staff call her and notify her.  Home with ibuprofen , Tylenol , Benadryl /Maalox mixture.   Discussed labs,  MDM, plan and followup with patient. patient agrees with plan.   Meds ordered this encounter  Medications   DISCONTD: amoxicillin -clavulanate (AUGMENTIN ) 875-125 MG tablet    Sig: Take 1 tablet by mouth every 12 (twelve) hours.    Dispense:  14 tablet    Refill:  0   fluticasone  (FLONASE ) 50 MCG/ACT nasal spray    Sig: Place 2 sprays into both nostrils daily.    Dispense:  16 g    Refill:  0   ibuprofen  (ADVIL ) 600 MG tablet     Sig: Take 1 tablet (600 mg total) by mouth every 8 (eight) hours as needed.    Dispense:  30 tablet    Refill:  0   promethazine -dextromethorphan (PROMETHAZINE -DM) 6.25-15 MG/5ML syrup    Sig: Take 5 mLs by mouth 4 (four) times daily as needed for cough.    Dispense:  118 mL    Refill:  0   amoxicillin -clavulanate (AUGMENTIN ) 875-125 MG tablet    Sig: Take 1 tablet by mouth every 12 (twelve) hours for 10 days.    Dispense:  20 tablet    Refill:  0     *This clinic note was created using Scientist, clinical (histocompatibility and immunogenetics). Therefore, there may be occasional mistakes despite careful proofreading.     Van Knee, MD 09/07/23 1029

## 2023-09-06 NOTE — ED Triage Notes (Signed)
Pt c/o cough, congestion, sore throat x 3 days.
